# Patient Record
Sex: Male | Born: 1945 | Race: White | Hispanic: No | Marital: Married | Smoking: Former smoker
Health system: Southern US, Community
[De-identification: ages and names within clinical notes are randomized; demographics above are authoritative.]

## PROBLEM LIST (undated history)

## (undated) DIAGNOSIS — E039 Hypothyroidism, unspecified: Secondary | ICD-10-CM

## (undated) DIAGNOSIS — M1990 Unspecified osteoarthritis, unspecified site: Secondary | ICD-10-CM

## (undated) DIAGNOSIS — E669 Obesity, unspecified: Secondary | ICD-10-CM

## (undated) DIAGNOSIS — E041 Nontoxic single thyroid nodule: Secondary | ICD-10-CM

## (undated) DIAGNOSIS — IMO0001 Reserved for inherently not codable concepts without codable children: Secondary | ICD-10-CM

## (undated) DIAGNOSIS — C73 Malignant neoplasm of thyroid gland: Secondary | ICD-10-CM

## (undated) HISTORY — DX: Obesity, unspecified: E66.9

---

## 1978-09-06 HISTORY — PX: FINGER AMPUTATION: SHX636

## 2011-02-12 ENCOUNTER — Emergency Department (HOSPITAL_COMMUNITY)
Admission: EM | Admit: 2011-02-12 | Discharge: 2011-02-12 | Disposition: A | Discharge status: Home / Self Care | Payer: Medicare Other | Attending: Emergency Medicine | Admitting: Emergency Medicine

## 2011-02-12 ENCOUNTER — Emergency Department (HOSPITAL_COMMUNITY): Payer: Medicare Other

## 2011-02-12 ENCOUNTER — Encounter (HOSPITAL_COMMUNITY): Payer: Self-pay | Admitting: Emergency Medicine

## 2011-02-12 DIAGNOSIS — IMO0002 Reserved for concepts with insufficient information to code with codable children: Secondary | ICD-10-CM | POA: Insufficient documentation

## 2011-02-12 DIAGNOSIS — M1710 Unilateral primary osteoarthritis, unspecified knee: Secondary | ICD-10-CM | POA: Insufficient documentation

## 2011-02-12 DIAGNOSIS — M25569 Pain in unspecified knee: Secondary | ICD-10-CM | POA: Insufficient documentation

## 2011-02-12 MED ORDER — ONDANSETRON 4 MG PO TBDP
4.0000 mg | ORAL_TABLET | Freq: Once | ORAL | Status: AC
  Administered 2011-02-12: 4 mg via ORAL

## 2011-02-12 MED ORDER — ONDANSETRON 4 MG PO TBDP
ORAL_TABLET | ORAL | Status: AC
  Filled 2011-02-12: qty 1

## 2011-02-12 MED ORDER — KETOROLAC TROMETHAMINE 60 MG/2ML IM SOLN
60.0000 mg | Freq: Once | INTRAMUSCULAR | Status: AC
  Administered 2011-02-12: 60 mg via INTRAMUSCULAR
  Filled 2011-02-12: qty 2

## 2011-02-12 MED ORDER — ONDANSETRON 4 MG PO TBDP
4.0000 mg | ORAL_TABLET | Freq: Once | ORAL | Status: AC
  Administered 2011-02-12: 4 mg via ORAL
  Filled 2011-02-12: qty 1

## 2011-02-12 MED ORDER — HYDROCODONE-ACETAMINOPHEN 5-325 MG PO TABS: ORAL_TABLET | ORAL | Status: DC

## 2011-02-12 MED ORDER — ONDANSETRON HCL 4 MG PO TABS: 4.0000 mg | ORAL_TABLET | Freq: Four times a day (QID) | ORAL | Status: AC

## 2011-02-12 MED ORDER — HYDROMORPHONE HCL PF 2 MG/ML IJ SOLN
2.0000 mg | Freq: Once | INTRAMUSCULAR | Status: AC
  Administered 2011-02-12: 2 mg via INTRAMUSCULAR
  Filled 2011-02-12: qty 1

## 2011-02-12 NOTE — ED Provider Notes (Signed)
History     CSN: [id number]  Arrival date & time 02/12/11  1722   None     Chief Complaint  Patient presents with  . Knee Pain    (Consider location/radiation/quality/duration/timing/severity/associated sxs/prior treatment) HPI Comments: Pt has had pain in both knees "for years".  He stood up from a chair at home and had a sharp pain in L knee and pain since.  Has taken aleve with no relief.  He has known DJD.  Patient is a 66 y.o. male presenting with knee pain. The history is provided by the patient. No language interpreter was used.  Knee Pain This is a new problem. The current episode started yesterday. The problem occurs constantly. The problem has been unchanged. The symptoms are aggravated by bending, walking and standing. He has tried nothing for the symptoms.    History reviewed. No pertinent past medical history.  History reviewed. No pertinent past surgical history.  No family history on file.  History  Substance Use Topics  . Smoking status: Never Smoker   . Smokeless tobacco: Not on file  . Alcohol Use: No      Review of Systems  Musculoskeletal:       Knee pain.    All other systems reviewed and are negative.    Allergies  Review of patient's allergies indicates no known allergies.  Home Medications   Current Outpatient Rx  Name Route Sig Dispense Refill  . HYDROCODONE-ACETAMINOPHEN 5-500 MG PO TABS Oral Take 1 tablet by mouth every 6 (six) hours as needed. For pain      BP 131/69  Pulse 92  Temp(Src) 97.9 F (36.6 C) (Oral)  Resp 20  SpO2 99%  Physical Exam  Nursing note and vitals reviewed. Constitutional: He is oriented to person, place, and time. He appears well-developed and well-nourished. No distress.  HENT:  Head: Normocephalic and atraumatic.  Eyes: EOM are normal.  Neck: Normal range of motion.  Cardiovascular: Normal rate, regular rhythm, normal heart sounds and intact distal pulses.   Pulmonary/Chest: Effort normal and  breath sounds normal. No respiratory distress.  Abdominal: Soft. He exhibits no distension. There is no tenderness.  Musculoskeletal: He exhibits tenderness.       Left knee: He exhibits decreased range of motion. He exhibits no swelling, no effusion, no ecchymosis, no deformity, no laceration and no erythema. tenderness found.       Legs:      Localizes pain to anterior joint line of L knee.  Neurological: He is alert and oriented to person, place, and time.  Skin: Skin is warm and dry. He is not diaphoretic.  Psychiatric: He has a normal mood and affect. Judgment normal.    ED Course  Procedures (including critical care time)  Labs Reviewed - No data to display No results found.   No diagnosis found.    MDM          Worthy Rancher, PA 02/12/11 [phone]

## 2011-02-12 NOTE — ED Notes (Signed)
Pt c/o left knee pain since yesterday.

## 2011-02-13 NOTE — ED Provider Notes (Signed)
Medical screening examination/treatment/procedure(s) were performed by non-physician practitioner and as supervising physician I was immediately available for consultation/collaboration.  Daniela Hernan S. Berline Semrad, MD 02/13/11 1508 

## 2011-04-21 ENCOUNTER — Ambulatory Visit (INDEPENDENT_AMBULATORY_CARE_PROVIDER_SITE_OTHER): Payer: Medicare Other | Admitting: Urology

## 2011-04-21 DIAGNOSIS — N471 Phimosis: Secondary | ICD-10-CM

## 2014-01-05 DIAGNOSIS — E041 Nontoxic single thyroid nodule: Secondary | ICD-10-CM

## 2014-01-05 HISTORY — DX: Nontoxic single thyroid nodule: E04.1

## 2014-07-04 ENCOUNTER — Other Ambulatory Visit: Payer: Self-pay | Admitting: Otolaryngology

## 2014-07-30 NOTE — Pre-Procedure Instructions (Signed)
   Jason Cohen  07/30/2014     Your procedure is scheduled on : Wednesday August 08, 2014 at 8:30 AM.  Report to [facility] Admitting at 6:30 A.M.  Call this number if you have problems the morning of surgery: [phone]    Remember:  Do not eat food or drink liquids after midnight.  Take these medicines the morning of surgery with A SIP OF WATER : Hydrocodone if needed   Do not wear jewelry.  Do not wear lotions, powders, or cologne.    Men may shave face and neck.  Do not bring valuables to the hospital.  [facility] is not responsible for any belongings or valuables.  Contacts, dentures or bridgework may not be worn into surgery.  Leave your suitcase in the car.  After surgery it may be brought to your room.  For patients admitted to the hospital, discharge time will be determined by your treatment team.  Patients discharged the day of surgery will not be allowed to drive home.   Name and phone number of your driver:    Special instructions:  Shower using CHG soap the night before and the morning of your surgery  Please read over the following fact sheets that you were given. Pain Booklet, Coughing and Deep Breathing and Surgical Site Infection Prevention

## 2014-07-31 ENCOUNTER — Encounter (HOSPITAL_COMMUNITY): Payer: Self-pay

## 2014-07-31 ENCOUNTER — Encounter (HOSPITAL_COMMUNITY)
Admission: RE | Admit: 2014-07-31 | Discharge: 2014-07-31 | Disposition: A | Discharge status: Home / Self Care | Payer: Medicare HMO | Source: Ambulatory Visit | Attending: Otolaryngology | Admitting: Otolaryngology

## 2014-07-31 ENCOUNTER — Ambulatory Visit (HOSPITAL_COMMUNITY)
Admission: RE | Admit: 2014-07-31 | Discharge: 2014-07-31 | Disposition: A | Discharge status: Home / Self Care | Payer: Medicare HMO | Source: Ambulatory Visit | Attending: Otolaryngology | Admitting: Otolaryngology

## 2014-07-31 DIAGNOSIS — Z01811 Encounter for preprocedural respiratory examination: Secondary | ICD-10-CM | POA: Diagnosis present

## 2014-07-31 DIAGNOSIS — Z01812 Encounter for preprocedural laboratory examination: Secondary | ICD-10-CM | POA: Insufficient documentation

## 2014-07-31 DIAGNOSIS — R918 Other nonspecific abnormal finding of lung field: Secondary | ICD-10-CM | POA: Diagnosis not present

## 2014-07-31 DIAGNOSIS — Z01818 Encounter for other preprocedural examination: Secondary | ICD-10-CM

## 2014-07-31 HISTORY — DX: Reserved for inherently not codable concepts without codable children: IMO0001

## 2014-07-31 HISTORY — DX: Nontoxic single thyroid nodule: E04.1

## 2014-07-31 LAB — BASIC METABOLIC PANEL
ANION GAP: 8 (ref 5–15)
BUN: 6 mg/dL (ref 6–20)
CO2: 26 mmol/L (ref 22–32)
CREATININE: 0.82 mg/dL (ref 0.61–1.24)
Calcium: 9.4 mg/dL (ref 8.9–10.3)
Chloride: 106 mmol/L (ref 101–111)
GLUCOSE: 100 mg/dL — AB (ref 65–99)
Potassium: 3.9 mmol/L (ref 3.5–5.1)
SODIUM: 140 mmol/L (ref 135–145)

## 2014-07-31 LAB — CBC
HEMATOCRIT: 42 % (ref 39.0–52.0)
Hemoglobin: 13.4 g/dL (ref 13.0–17.0)
MCH: 26.9 pg (ref 26.0–34.0)
MCHC: 31.9 g/dL (ref 30.0–36.0)
MCV: 84.2 fL (ref 78.0–100.0)
Platelets: 254 10*3/uL (ref 150–400)
RBC: 4.99 MIL/uL (ref 4.22–5.81)
RDW: 14.1 % (ref 11.5–15.5)
WBC: 6.3 10*3/uL (ref 4.0–10.5)

## 2014-07-31 NOTE — Progress Notes (Signed)
PCP Matthias Hughs. Patient denied having any cardiac issues, but did inform Nurse that he can not lay on his back due to increased shortness of breath, and that he has to sleep on his stomach. Patient denied having any current shortness of breath.   Sleep apnea results sent to PCP.

## 2014-07-31 NOTE — Progress Notes (Signed)
   07/31/14 1012  OBSTRUCTIVE SLEEP APNEA  Have you ever been diagnosed with sleep apnea through a sleep study? No  Do you snore loudly (loud enough to be heard through closed doors)?  1  Do you often feel tired, fatigued, or sleepy during the daytime? 1  Has anyone observed you stop breathing during your sleep? 0  Do you have, or are you being treated for high blood pressure? 0  BMI more than 35 kg/m2? 1  Age over 67 years old? 1  Neck circumference greater than 40 cm/16 inches? 1  Gender: 1  Obstructive Sleep Apnea Score 6   This patient has screened at risk for sleep apnea using the STOP bang tool used during a pre-surgical visit. A score of 4 or greater is at risk for sleep apnea.

## 2014-08-08 ENCOUNTER — Ambulatory Visit (HOSPITAL_COMMUNITY): Payer: Medicare HMO | Admitting: Anesthesiology

## 2014-08-08 ENCOUNTER — Ambulatory Visit (HOSPITAL_BASED_OUTPATIENT_CLINIC_OR_DEPARTMENT_OTHER)
Admission: RE | Admit: 2014-08-08 | Discharge: 2014-08-09 | Disposition: A | Discharge status: Home / Self Care | Payer: Medicare HMO | Source: Ambulatory Visit | Attending: Otolaryngology | Admitting: Otolaryngology

## 2014-08-08 ENCOUNTER — Encounter (HOSPITAL_COMMUNITY): Payer: Self-pay | Admitting: *Deleted

## 2014-08-08 ENCOUNTER — Encounter (HOSPITAL_COMMUNITY)
Admission: RE | Disposition: A | Discharge status: Home / Self Care | Payer: Self-pay | Source: Ambulatory Visit | Attending: Otolaryngology

## 2014-08-08 ENCOUNTER — Ambulatory Visit (HOSPITAL_COMMUNITY): Payer: Medicare HMO | Admitting: Vascular Surgery

## 2014-08-08 DIAGNOSIS — C73 Malignant neoplasm of thyroid gland: Secondary | ICD-10-CM | POA: Diagnosis not present

## 2014-08-08 DIAGNOSIS — E890 Postprocedural hypothyroidism: Secondary | ICD-10-CM

## 2014-08-08 DIAGNOSIS — Z89021 Acquired absence of right finger(s): Secondary | ICD-10-CM | POA: Diagnosis not present

## 2014-08-08 DIAGNOSIS — Z6841 Body Mass Index (BMI) 40.0 and over, adult: Secondary | ICD-10-CM | POA: Diagnosis not present

## 2014-08-08 DIAGNOSIS — E0789 Other specified disorders of thyroid: Secondary | ICD-10-CM | POA: Diagnosis present

## 2014-08-08 DIAGNOSIS — M170 Bilateral primary osteoarthritis of knee: Secondary | ICD-10-CM | POA: Diagnosis not present

## 2014-08-08 DIAGNOSIS — Z87891 Personal history of nicotine dependence: Secondary | ICD-10-CM | POA: Insufficient documentation

## 2014-08-08 DIAGNOSIS — Z9889 Other specified postprocedural states: Secondary | ICD-10-CM

## 2014-08-08 HISTORY — DX: Unspecified osteoarthritis, unspecified site: M19.90

## 2014-08-08 HISTORY — PX: THYROIDECTOMY: SHX17

## 2014-08-08 SURGERY — THYROIDECTOMY
Anesthesia: General | Site: Throat | Laterality: Right

## 2014-08-08 MED ORDER — OXYCODONE-ACETAMINOPHEN 5-325 MG PO TABS: 1.0000 | ORAL_TABLET | ORAL | Status: DC | PRN

## 2014-08-08 MED ORDER — LIDOCAINE-EPINEPHRINE 1 %-1:100000 IJ SOLN
INTRAMUSCULAR | Status: DC | PRN
  Administered 2014-08-08: 20 mL

## 2014-08-08 MED ORDER — OXYCODONE HCL 5 MG PO TABS: 5.0000 mg | ORAL_TABLET | Freq: Once | ORAL | Status: DC | PRN

## 2014-08-08 MED ORDER — SODIUM CHLORIDE 0.9 % IV SOLN
10000.0000 ug | INTRAVENOUS | Status: DC | PRN
  Administered 2014-08-08: 20 ug/min via INTRAVENOUS

## 2014-08-08 MED ORDER — ONDANSETRON HCL 4 MG/2ML IJ SOLN: 4.0000 mg | INTRAMUSCULAR | Status: DC | PRN

## 2014-08-08 MED ORDER — FENTANYL CITRATE (PF) 100 MCG/2ML IJ SOLN
INTRAMUSCULAR | Status: AC
  Administered 2014-08-08: 50 ug via INTRAVENOUS
  Filled 2014-08-08: qty 2

## 2014-08-08 MED ORDER — MIDAZOLAM HCL 5 MG/5ML IJ SOLN
INTRAMUSCULAR | Status: DC | PRN
  Administered 2014-08-08: 2 mg via INTRAVENOUS

## 2014-08-08 MED ORDER — SODIUM CHLORIDE 0.9 % IV SOLN
0.1500 ug/kg/min | INTRAVENOUS | Status: DC
  Filled 2014-08-08: qty 2000

## 2014-08-08 MED ORDER — PROPOFOL 10 MG/ML IV BOLUS
INTRAVENOUS | Status: DC | PRN
  Administered 2014-08-08: 70 mg via INTRAVENOUS
  Administered 2014-08-08: 60 mg via INTRAVENOUS

## 2014-08-08 MED ORDER — FENTANYL CITRATE (PF) 250 MCG/5ML IJ SOLN
INTRAMUSCULAR | Status: AC
  Filled 2014-08-08: qty 5

## 2014-08-08 MED ORDER — LIDOCAINE-EPINEPHRINE 1 %-1:100000 IJ SOLN
INTRAMUSCULAR | Status: AC
  Filled 2014-08-08: qty 1

## 2014-08-08 MED ORDER — SUCCINYLCHOLINE CHLORIDE 20 MG/ML IJ SOLN
INTRAMUSCULAR | Status: DC | PRN
  Administered 2014-08-08: 40 mg via INTRAVENOUS

## 2014-08-08 MED ORDER — ONDANSETRON HCL 4 MG PO TABS: 4.0000 mg | ORAL_TABLET | ORAL | Status: DC | PRN

## 2014-08-08 MED ORDER — GLYCOPYRROLATE 0.2 MG/ML IJ SOLN
INTRAMUSCULAR | Status: DC | PRN
  Administered 2014-08-08: 0.4 mg via INTRAVENOUS

## 2014-08-08 MED ORDER — LACTATED RINGERS IV SOLN
INTRAVENOUS | Status: DC | PRN
  Administered 2014-08-08 (×2): via INTRAVENOUS

## 2014-08-08 MED ORDER — ACETAMINOPHEN 160 MG/5ML PO SOLN
325.0000 mg | ORAL | Status: DC | PRN
  Filled 2014-08-08: qty 20.3

## 2014-08-08 MED ORDER — PROPOFOL 10 MG/ML IV BOLUS
INTRAVENOUS | Status: AC
  Filled 2014-08-08: qty 20

## 2014-08-08 MED ORDER — MORPHINE SULFATE 2 MG/ML IJ SOLN: 2.0000 mg | INTRAMUSCULAR | Status: DC | PRN

## 2014-08-08 MED ORDER — MIDAZOLAM HCL 2 MG/2ML IJ SOLN
INTRAMUSCULAR | Status: AC
  Filled 2014-08-08: qty 4

## 2014-08-08 MED ORDER — ACETAMINOPHEN 325 MG PO TABS: 325.0000 mg | ORAL_TABLET | ORAL | Status: DC | PRN

## 2014-08-08 MED ORDER — REMIFENTANIL HCL 1 MG IV SOLR
0.1500 ug/kg/min | INTRAVENOUS | Status: DC
  Filled 2014-08-08: qty 1000

## 2014-08-08 MED ORDER — KCL IN DEXTROSE-NACL 20-5-0.45 MEQ/L-%-% IV SOLN
INTRAVENOUS | Status: DC
  Administered 2014-08-08: 23:00:00 via INTRAVENOUS
  Administered 2014-08-08: 100 mL/h via INTRAVENOUS
  Filled 2014-08-08 (×2): qty 1000

## 2014-08-08 MED ORDER — ONDANSETRON HCL 4 MG/2ML IJ SOLN
INTRAMUSCULAR | Status: DC | PRN
  Administered 2014-08-08: 4 mg via INTRAVENOUS

## 2014-08-08 MED ORDER — SODIUM CHLORIDE 0.9 % IV SOLN
0.1500 ug/kg/min | INTRAVENOUS | Status: AC
  Administered 2014-08-08: .3 ug/kg/min via INTRAVENOUS
  Filled 2014-08-08: qty 2000

## 2014-08-08 MED ORDER — OXYCODONE-ACETAMINOPHEN 5-325 MG PO TABS
1.0000 | ORAL_TABLET | ORAL | Status: DC | PRN
  Administered 2014-08-09: 2 via ORAL
  Filled 2014-08-08: qty 2

## 2014-08-08 MED ORDER — DEXTROSE 5 % IV SOLN
3.0000 g | INTRAVENOUS | Status: DC | PRN
  Administered 2014-08-08: 3 g via INTRAVENOUS

## 2014-08-08 MED ORDER — LIDOCAINE HCL (CARDIAC) 20 MG/ML IV SOLN
INTRAVENOUS | Status: DC | PRN
  Administered 2014-08-08: 60 mg via INTRAVENOUS

## 2014-08-08 MED ORDER — DEXAMETHASONE SODIUM PHOSPHATE 10 MG/ML IJ SOLN
INTRAMUSCULAR | Status: DC | PRN
  Administered 2014-08-08: 10 mg via INTRAVENOUS

## 2014-08-08 MED ORDER — FENTANYL CITRATE (PF) 100 MCG/2ML IJ SOLN
25.0000 ug | INTRAMUSCULAR | Status: DC | PRN
  Administered 2014-08-08 (×2): 50 ug via INTRAVENOUS

## 2014-08-08 MED ORDER — EPHEDRINE SULFATE 50 MG/ML IJ SOLN
INTRAMUSCULAR | Status: DC | PRN
  Administered 2014-08-08 (×3): 5 mg via INTRAVENOUS

## 2014-08-08 MED ORDER — ONDANSETRON HCL 4 MG/2ML IJ SOLN
4.0000 mg | Freq: Once | INTRAMUSCULAR | Status: AC | PRN
  Administered 2014-08-08: 4 mg via INTRAVENOUS

## 2014-08-08 MED ORDER — FENTANYL CITRATE (PF) 100 MCG/2ML IJ SOLN
INTRAMUSCULAR | Status: DC | PRN
  Administered 2014-08-08: 50 ug via INTRAVENOUS
  Administered 2014-08-08: 100 ug via INTRAVENOUS

## 2014-08-08 MED ORDER — AMOXICILLIN 875 MG PO TABS
875.0000 mg | ORAL_TABLET | Freq: Two times a day (BID) | ORAL | Status: DC
Start: 2014-08-08 — End: 2014-08-18

## 2014-08-08 MED ORDER — ONDANSETRON HCL 4 MG/2ML IJ SOLN
INTRAMUSCULAR | Status: AC
  Administered 2014-08-08: 4 mg via INTRAVENOUS
  Filled 2014-08-08: qty 2

## 2014-08-08 MED ORDER — 0.9 % SODIUM CHLORIDE (POUR BTL) OPTIME
TOPICAL | Status: DC | PRN
  Administered 2014-08-08: 1000 mL

## 2014-08-08 MED ORDER — ZOLPIDEM TARTRATE 5 MG PO TABS: 5.0000 mg | ORAL_TABLET | Freq: Every evening | ORAL | Status: DC | PRN

## 2014-08-08 MED ORDER — OXYCODONE HCL 5 MG/5ML PO SOLN: 5.0000 mg | Freq: Once | ORAL | Status: DC | PRN

## 2014-08-08 SURGICAL SUPPLY — 47 items
ATTRACTOMAT 16X20 MAGNETIC DRP (DRAPES) IMPLANT
BLADE SURG 15 STRL LF DISP TIS (BLADE) ×1 IMPLANT
BLADE SURG 15 STRL SS (BLADE) ×2
CANISTER SUCTION 2500CC (MISCELLANEOUS) ×3 IMPLANT
CLEANER TIP ELECTROSURG 2X2 (MISCELLANEOUS) ×3 IMPLANT
CLIP TI WIDE RED SMALL 24 (CLIP) IMPLANT
CONT SPEC 4OZ CLIKSEAL STRL BL (MISCELLANEOUS) ×3 IMPLANT
CORDS BIPOLAR (ELECTRODE) ×3 IMPLANT
COVER SURGICAL LIGHT HANDLE (MISCELLANEOUS) ×3 IMPLANT
CRADLE DONUT ADULT HEAD (MISCELLANEOUS) ×3 IMPLANT
DERMABOND ADVANCED (GAUZE/BANDAGES/DRESSINGS) ×2
DERMABOND ADVANCED .7 DNX12 (GAUZE/BANDAGES/DRESSINGS) ×1 IMPLANT
DRAIN CHANNEL 10F 3/8 F FF (DRAIN) ×3 IMPLANT
DRAPE PROXIMA HALF (DRAPES) ×3 IMPLANT
ELECT COATED BLADE 2.86 ST (ELECTRODE) ×3 IMPLANT
ELECT REM PT RETURN 9FT ADLT (ELECTROSURGICAL) ×3
ELECTRODE REM PT RTRN 9FT ADLT (ELECTROSURGICAL) ×1 IMPLANT
EVACUATOR SILICONE 100CC (DRAIN) ×3 IMPLANT
GAUZE SPONGE 4X4 16PLY XRAY LF (GAUZE/BANDAGES/DRESSINGS) ×42 IMPLANT
GLOVE BIO SURGEON STRL SZ 6.5 (GLOVE) ×2 IMPLANT
GLOVE BIO SURGEON STRL SZ7.5 (GLOVE) ×3 IMPLANT
GLOVE BIO SURGEONS STRL SZ 6.5 (GLOVE) ×1
GLOVE BIOGEL PI IND STRL 6.5 (GLOVE) ×1 IMPLANT
GLOVE BIOGEL PI IND STRL 7.0 (GLOVE) ×1 IMPLANT
GLOVE BIOGEL PI INDICATOR 6.5 (GLOVE) ×2
GLOVE BIOGEL PI INDICATOR 7.0 (GLOVE) ×2
GLOVE ECLIPSE 7.5 STRL STRAW (GLOVE) ×3 IMPLANT
GLOVE SURG SS PI 7.0 STRL IVOR (GLOVE) ×3 IMPLANT
GOWN STRL REUS W/ TWL LRG LVL3 (GOWN DISPOSABLE) ×3 IMPLANT
GOWN STRL REUS W/TWL LRG LVL3 (GOWN DISPOSABLE) ×6
HEMOSTAT SURGICEL 2X14 (HEMOSTASIS) IMPLANT
KIT BASIN OR (CUSTOM PROCEDURE TRAY) ×3 IMPLANT
KIT ROOM TURNOVER OR (KITS) ×3 IMPLANT
NEEDLE HYPO 25GX1X1/2 BEV (NEEDLE) ×3 IMPLANT
NS IRRIG 1000ML POUR BTL (IV SOLUTION) ×3 IMPLANT
PAD ARMBOARD 7.5X6 YLW CONV (MISCELLANEOUS) ×3 IMPLANT
PENCIL BUTTON HOLSTER BLD 10FT (ELECTRODE) ×3 IMPLANT
PROBE NERVBE PRASS .33 (MISCELLANEOUS) ×3 IMPLANT
SHEARS HARMONIC 9CM CVD (BLADE) ×3 IMPLANT
SPONGE INTESTINAL PEANUT (DISPOSABLE) ×3 IMPLANT
SUT ETHILON 2 0 FS 18 (SUTURE) ×3 IMPLANT
SUT SILK 2 0 FS (SUTURE) ×3 IMPLANT
SUT SILK 3 0 REEL (SUTURE) ×3 IMPLANT
SUT VICRYL 4-0 PS2 18IN ABS (SUTURE) ×3 IMPLANT
TAPE CLOTH 4X10 WHT NS (GAUZE/BANDAGES/DRESSINGS) ×3 IMPLANT
TRAY ENT MC OR (CUSTOM PROCEDURE TRAY) ×3 IMPLANT
TUBE ENDOTRAC EMG 7X10.2 (MISCELLANEOUS) ×3 IMPLANT

## 2014-08-08 NOTE — H&P (Signed)
Cc: Right thyroid mass  HPI: The patient is a 69 year old male who presents today for evaluation of his right thyroid mass.  The patient is seen in consultation requested by Dr. Matthias Hughs. According to the patient, Dr. Scotty Court noted a right thyroid mass in May of this year.  He subsequently underwent an ultrasound evaluation, which showed a large 8 cm right thyroid nodule.  He underwent a fine needle aspiration biopsy of the nodule.  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  Currently, the patient denies any dysphagia, odynophagia or dysphonia.  He denies any recent weight loss.    The patient's review of systems (constitutional, eyes, ENT, cardiovascular, respiratory, GI, musculoskeletal, skin, neurologic, psychiatric, endocrine, hematologic, allergic) is noted in the ROS questionnaire.  It is reviewed with the patient.  Family health history: Heart disease.   Major events: Right finger amputation.   Ongoing medical problems: Arthritis in knees.   Social history: The patient is married. He is a former tobacco smoker. He denies the use of alcohol or illegal drugs.   Exam General: Communicates without difficulty, well nourished, no acute distress. Head: Normocephalic, no evidence injury, no tenderness, facial buttresses intact without stepoff. Eyes: PERRL, EOMI. No scleral icterus, conjunctivae clear. Neuro: CN II exam reveals vision grossly intact.  No nystagmus at any point of gaze. Ears: Auricles well formed without lesions.  Ear canals are intact without mass or lesion.  No erythema or edema is appreciated.  The TMs are intact without fluid. Nose: External evaluation reveals normal support and skin without lesions.  Dorsum is intact.  Anterior rhinoscopy reveals healthy pink mucosa over anterior aspect of inferior turbinates and intact septum.  No purulence noted. Oral:  Oral cavity and oropharynx are intact, symmetric, without erythema  or edema.  Mucosa is moist without lesions. Neck: Full range of motion without pain.  There is no significant lymphadenopathy.  A large right thyroid lobe is noted. Neuro:  CN 2-12 grossly intact. Gait normal.   Procedure:  Flexible Fiberoptic Laryngoscopy Risks, benefits, and alternatives of flexible endoscopy were explained to the patient.  Specific mention was made of the risk of throat numbness with difficulty swallowing, possible bleeding from the nose and mouth, and pain from the procedure.  The patient gave oral consent to proceed.  The nasal cavities were decongested and anesthetised with a combination of oxymetazoline and 4% lidocaine solution.  The flexible scope was inserted into the right nasal cavity and advanced towards the nasopharynx.  Visualized mucosa over the turbinates and septum were as described above.  The nasopharynx was clear.  Oropharyngeal walls were symmetric and mobile without lesion, mass, or edema.  Hypopharynx was also without  lesion or edema.  Larynx was mobile without lesions. Supraglottic structures were free of edema, mass, and asymmetry.  True vocal folds were white without mass or lesion.  Base of tongue was within normal limits.   Assessment 1.  A large 8 cm right thyroid nodule is noted on his ultrasound examination.  2.  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   3.  Despite the large size of the right thyroid mass, the patient remains asymptomatic.  His vocal cords are noted to be mobile and symmetric bilaterally.   Plan  1.  The physical exam findings and laryngoscopy findings are reviewed with the patient.  2.  It is explained to the patient that a follicular neoplasm can be an adenoma  or a carcinoma.  The difference between the two cannot be assessed on the fine needle aspiration biopsy.  The differences can only be appreciated on a tissue specimen. A right hemithyroidectomy surgery is therefore recommended.  3.  The  risks, benefits, alternatives and details of the hemithyroidectomy procedure are discussed.  Questions are invited and answered.  4.  The patient would like to proceed with the procedure.

## 2014-08-08 NOTE — Discharge Instructions (Signed)
Thyroidectomy Care After Refer to this sheet in the next few weeks. These instructions provide you with general information on caring for yourself after you leave the hospital. Your caregiver also may give you specific instructions. Your treatment has been planned according to the most current medical practices available, but problems sometimes occur. Call your caregiver if you have any problems or questions after your procedure. HOME CARE INSTRUCTIONS   It is normal to be sore for a few weeks following surgery. See your caregiver if your pain seems to be getting worse rather than better.  You may resume a normal diet and activities as directed by your caregiver.  Avoid lifting weight greater than 20 lb (9 kg) or participating in heavy exercise or contact sports for 10 days or as instructed by your caregiver.  Make an appointment to see your caregiver for stitch (suture) or staple removal. SEEK MEDICAL CARE IF:   You have increased bleeding from your wound.  You have redness, swelling, or increasing pain from your wound or in your neck.  There is pus coming from your wound.  There is a bad smell coming from the wound or dressing.  You develop lightheadedness or feel faint.  You develop numbness, tingling, or muscle spasms in your arms, hands, feet, or face.  You have difficulty swallowing. SEEK IMMEDIATE MEDICAL CARE IF:   You develop a rash.  You have difficulty breathing.  You hear whistling noises that come from your chest.  You develop a cough that becomes increasingly worse.  You develop any reaction or side effects to medicines given.  There is swelling in your neck.  You develop changes in speech or hoarseness, which is getting worse. MAKE SURE YOU:   Understand these instructions.  Will watch your condition.  Will get help right away if you are not doing well or get worse. Document Released: 07/11/2004 Document Revised: 03/16/2011 Document Reviewed:  02/28/2010 [facility] Patient Information 2015 [place], [place]. This information is not intended to replace advice given to you by your health care provider. Make sure you discuss any questions you have with your health care provider.

## 2014-08-08 NOTE — Transfer of Care (Signed)
Immediate Anesthesia Transfer of Care Note  Patient: Jason Cohen  Procedure(s) Performed: Procedure(s): RIGHT HEMI THYROIDECTOMY (Right)  Patient Location: PACU  Anesthesia Type:General  Level of Consciousness: awake, alert  and patient cooperative  Airway & Oxygen Therapy: Patient Spontanous Breathing and Patient connected to face mask oxygen  Post-op Assessment: Report given to RN, Post -op Vital signs reviewed and stable, Patient moving all extremities and Patient moving all extremities X 4  Post vital signs: Reviewed and stable  Last Vitals:  Filed Vitals:   08/08/14 1115  BP: 142/87  Pulse: 101  Temp: 36.6 C  Resp: 13    Complications: No apparent anesthesia complications

## 2014-08-08 NOTE — Anesthesia Postprocedure Evaluation (Signed)
  Anesthesia Post-op Note  Patient: Jason Cohen  Procedure(s) Performed: Procedure(s): RIGHT HEMI THYROIDECTOMY (Right)  Patient Location: PACU  Anesthesia Type:General  Level of Consciousness: awake  Airway and Oxygen Therapy: Patient Spontanous Breathing  Post-op Pain: mild  Post-op Assessment: Post-op Vital signs reviewed, Patient's Cardiovascular Status Stable, Respiratory Function Stable, Patent Airway, No signs of Nausea or vomiting and Pain level controlled              Post-op Vital Signs: Reviewed and stable  Last Vitals:  Filed Vitals:   08/08/14 1429  BP: 113/75  Pulse: 96  Temp: 36.8 C  Resp: 16    Complications: No apparent anesthesia complications

## 2014-08-08 NOTE — Anesthesia Procedure Notes (Signed)
Procedure Name: Intubation Date/Time: 08/08/2014 8:41 AM Performed by: Shirlyn Goltz Pre-anesthesia Checklist: Patient identified, Emergency Drugs available, Suction available and Patient being monitored Patient Re-evaluated:Patient Re-evaluated prior to inductionOxygen Delivery Method: Circle system utilized Preoxygenation: Pre-oxygenation with 100% oxygen Intubation Type: IV induction Ventilation: Mask ventilation without difficulty, Two handed mask ventilation required and Oral airway inserted - appropriate to patient size Laryngoscope Size: Mac and 4 Grade View: Grade I Tube type: nims. Tube size: 7.0 mm Number of attempts: 1 Airway Equipment and Method: Stylet Placement Confirmation: ETT inserted through vocal cords under direct vision,  positive ETCO2 and breath sounds checked- equal and bilateral Tube secured with: Tape Dental Injury: Teeth and Oropharynx as per pre-operative assessment

## 2014-08-08 NOTE — Anesthesia Preprocedure Evaluation (Signed)
Anesthesia Evaluation  Patient identified by MRN, date of birth, ID band Patient awake    Reviewed: Allergy & Precautions, NPO status , Patient's Chart, lab work & pertinent test results  History of Anesthesia Complications Negative for: history of anesthetic complications  Airway Mallampati: IV  TM Distance: >3 FB Neck ROM: Full    Dental  (+) Edentulous Upper   Pulmonary  Unable to lie flat due to SOB breath sounds clear to auscultation        Cardiovascular negative cardio ROS  Rhythm:Regular     Neuro/Psych negative neurological ROS  negative psych ROS   GI/Hepatic negative GI ROS, Neg liver ROS,   Endo/Other  Morbid obesityThyroid mass  Renal/GU negative Renal ROS     Musculoskeletal negative musculoskeletal ROS (+)   Abdominal   Peds  Hematology negative hematology ROS (+)   Anesthesia Other Findings   Reproductive/Obstetrics                             Anesthesia Physical Anesthesia Plan  ASA: II  Anesthesia Plan: General   Post-op Pain Management:    Induction: Intravenous  Airway Management Planned: Oral ETT  Additional Equipment: None  Intra-op Plan:   Post-operative Plan: Extubation in OR  Informed Consent: I have reviewed the patients History and Physical, chart, labs and discussed the procedure including the risks, benefits and alternatives for the proposed anesthesia with the patient or authorized representative who has indicated his/her understanding and acceptance.   Dental advisory given  Plan Discussed with: CRNA and Surgeon  Anesthesia Plan Comments:         Anesthesia Quick Evaluation

## 2014-08-08 NOTE — Op Note (Signed)
DATE OF PROCEDURE:  08/08/2014                              OPERATIVE REPORT  SURGEON:  Leta Baptist, MD  ASST.: Rometta Emery, PA-C  PREOPERATIVE DIAGNOSES: 1. Right thyroid mass  POSTOPERATIVE DIAGNOSES: 1. Right thyroid mass  PROCEDURE PERFORMED:  Right hemithyroidectomy  ANESTHESIA:  General endotracheal tube anesthesia.  COMPLICATIONS:  None.  ESTIMATED BLOOD LOSS:  131ml  INDICATION FOR PROCEDURE:  Jason Cohen is a 69 y.o. male with a history of a large right thyroid mass. He underwent an ultrasound evaluation, which showed a large 8 cm right thyroid nodule. 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 Based on the above findings, the decision was made for patient to undergo the right hemithyroidectomy surgery. The risks, benefits, alternatives, and details of the procedure were discussed with the patient.  Questions were invited and answered.  Informed consent was obtained.  DESCRIPTION:  The patient was taken to the operating room and placed supine on the operating table.  General endotracheal tube anesthesia was administered by the anesthesiologist.  A nerve monitoring endotracheal tube was used.The patient was positioned and prepped and draped in a standard fashion for thyroid surgery.  1% lidocaine with 1-100,000 epinephrine was infiltrated at the planned site of incision. A transverse lower neck incision was made. The incision was carried down to the level of the platysma muscles. Superior and inferiorly based subplatysmal flaps were elevated in the standard fashion. The strap muscles were divided at midline, and retracted laterally, exposing the thyroid gland. The patient was noted to have a very large right thyroid mass, replacing most of the right thyroid lobe. Careful dissection was then performed to free the right thyroid lobe from the surrounding soft tissue. The recurrent laryngeal nerve  was stimulated and preserved. Hemostasis was achieved with a combination of suture ligatures, Harmonic scalpels, and electrocautery. The entire right thyroid lobe was removed and sent to the pathology department for permanent histologic identification.   The care of the patient was turned over to the anesthesiologist.  The patient was awakened from anesthesia without difficulty.  He was extubated and transferred to the recovery room in good condition.  OPERATIVE FINDINGS: A large 8 cm right thyroid mass was noted.  SPECIMEN:  Right thyroid lobe   FOLLOWUP CARE:  The patient will be observed overnight in the hospital. He will most likely be discharged home tomorrow.  The patient will follow up in my office in approximately 1 week.  Jason Cohen 08/08/2014 10:52 AM

## 2014-08-09 ENCOUNTER — Encounter (HOSPITAL_COMMUNITY): Payer: Self-pay | Admitting: Otolaryngology

## 2014-08-09 DIAGNOSIS — C73 Malignant neoplasm of thyroid gland: Secondary | ICD-10-CM | POA: Diagnosis not present

## 2014-08-09 NOTE — Discharge Summary (Signed)
Physician Discharge Summary  Patient ID: Jason Cohen MRN: [id number] DOB/AGE: 67-Jan-1947 67 y.o.  Admit date: 08/08/2014 Discharge date: 08/09/2014  Admission Diagnoses: Right thyroid mass  Discharge Diagnoses: Right thyroid mass Active Problems:   S/P partial thyroidectomy   Discharged Condition: good  Hospital Course: Pt had an uneventful overnight stay. Pt tolerated po well. No bleeding. No stridor. Voice is strong.  Consults: None  Significant Diagnostic Studies: none  Treatments: surgery: Right hemithyroidectomy  Discharge Exam: Blood pressure 119/64, pulse 68, temperature 98.3 F (36.8 C), temperature source Oral, resp. rate 17, height 6' (1.829 m), weight 166.017 kg (366 lb), SpO2 100 %. Incision/Wound:c/d/i Voice is strong  Disposition: 01-Home or Self Care  Discharge Instructions    Activity as tolerated - No restrictions    Complete by:  As directed      Diet general    Complete by:  As directed             Medication List    STOP taking these medications        HYDROcodone-acetaminophen 5-325 MG per tablet  Commonly known as:  NORCO/VICODIN      TAKE these medications        amoxicillin 875 MG tablet  Commonly known as:  AMOXIL  Take 1 tablet (875 mg total) by mouth 2 (two) times daily.     oxyCODONE-acetaminophen 5-325 MG per tablet  Commonly known as:  PERCOCET/ROXICET  Take 1-2 tablets by mouth every 4 (four) hours as needed for severe pain.           Follow-up Information    Follow up with [name], MD In 1 week.   Specialty:  Otolaryngology   Why:  As scheduled   Contact information:   [place] [id number] [phone]       Signed: [name] 08/09/2014, 7:35 AM

## 2014-08-09 NOTE — Progress Notes (Signed)
Pt verbally understood DC instructions, no questions asked 

## 2014-08-14 ENCOUNTER — Other Ambulatory Visit: Payer: Self-pay | Admitting: Otolaryngology

## 2014-08-16 ENCOUNTER — Encounter (HOSPITAL_COMMUNITY): Payer: Self-pay | Admitting: *Deleted

## 2014-08-16 NOTE — Progress Notes (Signed)
   08/16/14 1820  OBSTRUCTIVE SLEEP APNEA  Have you ever been diagnosed with sleep apnea through a sleep study? No  Do you snore loudly (loud enough to be heard through closed doors)?  1  Do you often feel tired, fatigued, or sleepy during the daytime? 1  Has anyone observed you stop breathing during your sleep? 0  BMI more than 35 kg/m2? 1  Age over 67 years old? 1  Gender: 1  Obstructive Sleep Apnea Score 5

## 2014-08-16 NOTE — Progress Notes (Signed)
Pt denies SOB, chest pain, and being under the care of a cardiologist. Pt denies having a stress test,echo and cardiac cath. Pt denies having an EKG within the last year. Pt made aware to stop  taking Aspirin, otc vitamins  and herbal medications. Do not take any NSAIDs ie: Ibuprofen, Advil, Naproxen or any medication containing Aspirin. Pt verbalized understanding of all pre-op instructions.

## 2014-08-17 ENCOUNTER — Encounter (HOSPITAL_COMMUNITY)
Admission: RE | Disposition: A | Discharge status: Home / Self Care | Payer: Self-pay | Source: Ambulatory Visit | Attending: Otolaryngology

## 2014-08-17 ENCOUNTER — Ambulatory Visit (HOSPITAL_COMMUNITY): Payer: Medicare HMO | Admitting: Anesthesiology

## 2014-08-17 ENCOUNTER — Ambulatory Visit (HOSPITAL_COMMUNITY)
Admission: RE | Admit: 2014-08-17 | Discharge: 2014-08-18 | Disposition: A | Discharge status: Home / Self Care | Payer: Medicare HMO | Source: Ambulatory Visit | Attending: Otolaryngology | Admitting: Otolaryngology

## 2014-08-17 ENCOUNTER — Encounter (HOSPITAL_COMMUNITY): Payer: Self-pay | Admitting: Anesthesiology

## 2014-08-17 DIAGNOSIS — Z6841 Body Mass Index (BMI) 40.0 and over, adult: Secondary | ICD-10-CM | POA: Insufficient documentation

## 2014-08-17 DIAGNOSIS — C73 Malignant neoplasm of thyroid gland: Secondary | ICD-10-CM | POA: Insufficient documentation

## 2014-08-17 DIAGNOSIS — E039 Hypothyroidism, unspecified: Secondary | ICD-10-CM | POA: Insufficient documentation

## 2014-08-17 DIAGNOSIS — M1990 Unspecified osteoarthritis, unspecified site: Secondary | ICD-10-CM | POA: Insufficient documentation

## 2014-08-17 DIAGNOSIS — Z9889 Other specified postprocedural states: Secondary | ICD-10-CM

## 2014-08-17 DIAGNOSIS — Z87891 Personal history of nicotine dependence: Secondary | ICD-10-CM | POA: Insufficient documentation

## 2014-08-17 DIAGNOSIS — D34 Benign neoplasm of thyroid gland: Secondary | ICD-10-CM | POA: Insufficient documentation

## 2014-08-17 DIAGNOSIS — R0602 Shortness of breath: Secondary | ICD-10-CM | POA: Diagnosis not present

## 2014-08-17 DIAGNOSIS — E890 Postprocedural hypothyroidism: Secondary | ICD-10-CM

## 2014-08-17 HISTORY — PX: THYROIDECTOMY: SHX17

## 2014-08-17 HISTORY — DX: Malignant neoplasm of thyroid gland: C73

## 2014-08-17 HISTORY — DX: Hypothyroidism, unspecified: E03.9

## 2014-08-17 HISTORY — PX: TOTAL THYROIDECTOMY: SHX2547

## 2014-08-17 LAB — BASIC METABOLIC PANEL
Anion gap: 8 (ref 5–15)
BUN: 6 mg/dL (ref 6–20)
CALCIUM: 9.4 mg/dL (ref 8.9–10.3)
CO2: 27 mmol/L (ref 22–32)
Chloride: 105 mmol/L (ref 101–111)
Creatinine, Ser: 0.83 mg/dL (ref 0.61–1.24)
GFR calc Af Amer: >60 mL/min (ref >60)
GFR calc non Af Amer: >60 mL/min (ref >60)
GLUCOSE: 111 mg/dL — AB (ref 65–99)
Potassium: 4 mmol/L (ref 3.5–5.1)
Sodium: 140 mmol/L (ref 135–145)

## 2014-08-17 LAB — CBC
HCT: 35.6 % — ABNORMAL LOW (ref 39.0–52.0)
Hemoglobin: 11.1 g/dL — ABNORMAL LOW (ref 13.0–17.0)
MCH: 26.4 pg (ref 26.0–34.0)
MCHC: 31.2 g/dL (ref 30.0–36.0)
MCV: 84.6 fL (ref 78.0–100.0)
PLATELETS: 228 10*3/uL (ref 150–400)
RBC: 4.21 MIL/uL — ABNORMAL LOW (ref 4.22–5.81)
RDW: 14.3 % (ref 11.5–15.5)
WBC: 6.4 10*3/uL (ref 4.0–10.5)

## 2014-08-17 LAB — CALCIUM
CALCIUM: 9 mg/dL (ref 8.9–10.3)
Calcium: 8.9 mg/dL (ref 8.9–10.3)

## 2014-08-17 SURGERY — THYROIDECTOMY
Anesthesia: General | Site: Neck

## 2014-08-17 MED ORDER — ONDANSETRON HCL 4 MG/2ML IJ SOLN: 4.0000 mg | INTRAMUSCULAR | Status: DC | PRN

## 2014-08-17 MED ORDER — SUCCINYLCHOLINE CHLORIDE 20 MG/ML IJ SOLN
INTRAMUSCULAR | Status: DC | PRN
  Administered 2014-08-17: 60 mg via INTRAVENOUS

## 2014-08-17 MED ORDER — MIDAZOLAM HCL 5 MG/5ML IJ SOLN
INTRAMUSCULAR | Status: DC | PRN
  Administered 2014-08-17: 2 mg via INTRAVENOUS

## 2014-08-17 MED ORDER — LIDOCAINE-EPINEPHRINE 1 %-1:100000 IJ SOLN
INTRAMUSCULAR | Status: AC
  Filled 2014-08-17: qty 1

## 2014-08-17 MED ORDER — DEXTROSE 5 % IV SOLN
10.0000 mg | INTRAVENOUS | Status: DC | PRN
  Administered 2014-08-17: 25 ug/min via INTRAVENOUS

## 2014-08-17 MED ORDER — HYDROMORPHONE HCL 1 MG/ML IJ SOLN
INTRAMUSCULAR | Status: AC
  Filled 2014-08-17: qty 1

## 2014-08-17 MED ORDER — EPHEDRINE SULFATE 50 MG/ML IJ SOLN
INTRAMUSCULAR | Status: AC
  Filled 2014-08-17: qty 1

## 2014-08-17 MED ORDER — ONDANSETRON HCL 4 MG/2ML IJ SOLN
INTRAMUSCULAR | Status: DC | PRN
  Administered 2014-08-17: 4 mg via INTRAVENOUS

## 2014-08-17 MED ORDER — ZOLPIDEM TARTRATE 5 MG PO TABS: 5.0000 mg | ORAL_TABLET | Freq: Every evening | ORAL | Status: DC | PRN

## 2014-08-17 MED ORDER — OXYCODONE HCL 5 MG/5ML PO SOLN: 5.0000 mg | Freq: Once | ORAL | Status: DC | PRN

## 2014-08-17 MED ORDER — ACETAMINOPHEN 160 MG/5ML PO SOLN: 325.0000 mg | ORAL | Status: DC | PRN

## 2014-08-17 MED ORDER — SODIUM CHLORIDE 0.9 % IJ SOLN
INTRAMUSCULAR | Status: AC
  Filled 2014-08-17: qty 10

## 2014-08-17 MED ORDER — MORPHINE SULFATE 2 MG/ML IJ SOLN: 2.0000 mg | INTRAMUSCULAR | Status: DC | PRN

## 2014-08-17 MED ORDER — FENTANYL CITRATE (PF) 250 MCG/5ML IJ SOLN
INTRAMUSCULAR | Status: AC
  Filled 2014-08-17: qty 5

## 2014-08-17 MED ORDER — HYDROMORPHONE HCL 1 MG/ML IJ SOLN
0.2500 mg | INTRAMUSCULAR | Status: DC | PRN
  Administered 2014-08-17: 0.5 mg via INTRAVENOUS

## 2014-08-17 MED ORDER — ONDANSETRON HCL 4 MG PO TABS: 4.0000 mg | ORAL_TABLET | ORAL | Status: DC | PRN

## 2014-08-17 MED ORDER — ROCURONIUM BROMIDE 50 MG/5ML IV SOLN
INTRAVENOUS | Status: AC
  Filled 2014-08-17: qty 1

## 2014-08-17 MED ORDER — HYDROMORPHONE HCL 1 MG/ML IJ SOLN
0.2500 mg | INTRAMUSCULAR | Status: DC | PRN
Start: 2014-08-17 — End: 2014-08-17
  Administered 2014-08-17: 0.5 mg via INTRAVENOUS

## 2014-08-17 MED ORDER — FENTANYL CITRATE (PF) 100 MCG/2ML IJ SOLN
INTRAMUSCULAR | Status: DC | PRN
  Administered 2014-08-17: 100 ug via INTRAVENOUS
  Administered 2014-08-17 (×3): 50 ug via INTRAVENOUS

## 2014-08-17 MED ORDER — 0.9 % SODIUM CHLORIDE (POUR BTL) OPTIME
TOPICAL | Status: DC | PRN
  Administered 2014-08-17: 1000 mL

## 2014-08-17 MED ORDER — MIDAZOLAM HCL 2 MG/2ML IJ SOLN
INTRAMUSCULAR | Status: AC
  Filled 2014-08-17: qty 4

## 2014-08-17 MED ORDER — LACTATED RINGERS IV SOLN
INTRAVENOUS | Status: DC
  Administered 2014-08-17: 08:00:00 via INTRAVENOUS

## 2014-08-17 MED ORDER — CEFAZOLIN SODIUM-DEXTROSE 2-3 GM-% IV SOLR
INTRAVENOUS | Status: AC
  Filled 2014-08-17: qty 50

## 2014-08-17 MED ORDER — LIDOCAINE HCL (CARDIAC) 20 MG/ML IV SOLN
INTRAVENOUS | Status: DC | PRN
  Administered 2014-08-17: 40 mg via INTRAVENOUS

## 2014-08-17 MED ORDER — ONDANSETRON HCL 4 MG/2ML IJ SOLN
INTRAMUSCULAR | Status: AC
  Filled 2014-08-17: qty 2

## 2014-08-17 MED ORDER — OXYCODONE HCL 5 MG PO TABS
5.0000 mg | ORAL_TABLET | Freq: Once | ORAL | Status: DC | PRN
Start: 2014-08-17 — End: 2014-08-17

## 2014-08-17 MED ORDER — CALCIUM CARBONATE-VITAMIN D 500-200 MG-UNIT PO TABS
2.0000 | ORAL_TABLET | Freq: Two times a day (BID) | ORAL | Status: DC
  Administered 2014-08-17 – 2014-08-18 (×3): 2 via ORAL
  Filled 2014-08-17: qty 2
  Filled 2014-08-17: qty 1
  Filled 2014-08-17: qty 2

## 2014-08-17 MED ORDER — KCL IN DEXTROSE-NACL 20-5-0.45 MEQ/L-%-% IV SOLN
INTRAVENOUS | Status: DC
  Administered 2014-08-17 (×2): via INTRAVENOUS
  Filled 2014-08-17 (×2): qty 1000

## 2014-08-17 MED ORDER — LIDOCAINE HCL (CARDIAC) 20 MG/ML IV SOLN
INTRAVENOUS | Status: AC
  Filled 2014-08-17: qty 5

## 2014-08-17 MED ORDER — OXYCODONE-ACETAMINOPHEN 5-325 MG PO TABS
1.0000 | ORAL_TABLET | ORAL | Status: DC | PRN
  Administered 2014-08-17 (×3): 1 via ORAL
  Filled 2014-08-17: qty 1
  Filled 2014-08-17: qty 2
  Filled 2014-08-17: qty 1

## 2014-08-17 MED ORDER — PROPOFOL 10 MG/ML IV BOLUS
INTRAVENOUS | Status: AC
  Filled 2014-08-17: qty 20

## 2014-08-17 MED ORDER — ACETAMINOPHEN 325 MG PO TABS: 325.0000 mg | ORAL_TABLET | ORAL | Status: DC | PRN

## 2014-08-17 MED ORDER — PROPOFOL 10 MG/ML IV BOLUS
INTRAVENOUS | Status: DC | PRN
  Administered 2014-08-17: 200 mg via INTRAVENOUS
  Administered 2014-08-17: 30 mg via INTRAVENOUS

## 2014-08-17 MED ORDER — CEFAZOLIN SODIUM-DEXTROSE 2-3 GM-% IV SOLR
INTRAVENOUS | Status: DC | PRN
  Administered 2014-08-17: 2 g via INTRAVENOUS

## 2014-08-17 MED ORDER — LIDOCAINE-EPINEPHRINE 1 %-1:100000 IJ SOLN
INTRAMUSCULAR | Status: DC | PRN
  Administered 2014-08-17: 20 mL

## 2014-08-17 SURGICAL SUPPLY — 50 items
ATTRACTOMAT 16X20 MAGNETIC DRP (DRAPES) ×3 IMPLANT
BLADE SURG 15 STRL LF DISP TIS (BLADE) IMPLANT
BLADE SURG 15 STRL SS (BLADE)
BLADE SURG CLIPPER 3M 9600 (MISCELLANEOUS) IMPLANT
CANISTER SUCTION 2500CC (MISCELLANEOUS) ×3 IMPLANT
CLEANER TIP ELECTROSURG 2X2 (MISCELLANEOUS) ×3 IMPLANT
CLIP TI WIDE RED SMALL 24 (CLIP) IMPLANT
CONT SPEC 4OZ CLIKSEAL STRL BL (MISCELLANEOUS) IMPLANT
CORDS BIPOLAR (ELECTRODE) ×3 IMPLANT
COVER SURGICAL LIGHT HANDLE (MISCELLANEOUS) ×3 IMPLANT
CRADLE DONUT ADULT HEAD (MISCELLANEOUS) ×3 IMPLANT
DERMABOND ADVANCED (GAUZE/BANDAGES/DRESSINGS) ×2
DERMABOND ADVANCED .7 DNX12 (GAUZE/BANDAGES/DRESSINGS) ×1 IMPLANT
DRAIN CHANNEL 10F 3/8 F FF (DRAIN) ×3 IMPLANT
DRAPE PROXIMA HALF (DRAPES) ×3 IMPLANT
ELECT COATED BLADE 2.86 ST (ELECTRODE) ×3 IMPLANT
ELECT REM PT RETURN 9FT ADLT (ELECTROSURGICAL) ×3
ELECTRODE REM PT RTRN 9FT ADLT (ELECTROSURGICAL) ×1 IMPLANT
EVACUATOR SILICONE 100CC (DRAIN) ×3 IMPLANT
GAUZE SPONGE 4X4 16PLY XRAY LF (GAUZE/BANDAGES/DRESSINGS) ×3 IMPLANT
GLOVE BIO SURGEON STRL SZ 6.5 (GLOVE) ×2 IMPLANT
GLOVE BIO SURGEON STRL SZ7.5 (GLOVE) ×9 IMPLANT
GLOVE BIO SURGEONS STRL SZ 6.5 (GLOVE) ×1
GLOVE BIOGEL PI IND STRL 6.5 (GLOVE) ×1 IMPLANT
GLOVE BIOGEL PI IND STRL 7.5 (GLOVE) ×2 IMPLANT
GLOVE BIOGEL PI INDICATOR 6.5 (GLOVE) ×2
GLOVE BIOGEL PI INDICATOR 7.5 (GLOVE) ×4
GLOVE ECLIPSE 7.5 STRL STRAW (GLOVE) ×3 IMPLANT
GOWN STRL REUS W/ TWL LRG LVL3 (GOWN DISPOSABLE) ×3 IMPLANT
GOWN STRL REUS W/TWL LRG LVL3 (GOWN DISPOSABLE) ×6
HEMOSTAT SURGICEL 2X14 (HEMOSTASIS) IMPLANT
KIT BASIN OR (CUSTOM PROCEDURE TRAY) ×3 IMPLANT
KIT ROOM TURNOVER OR (KITS) ×3 IMPLANT
NEEDLE HYPO 25GX1X1/2 BEV (NEEDLE) ×3 IMPLANT
NS IRRIG 1000ML POUR BTL (IV SOLUTION) ×3 IMPLANT
PAD ARMBOARD 7.5X6 YLW CONV (MISCELLANEOUS) ×3 IMPLANT
PENCIL BUTTON HOLSTER BLD 10FT (ELECTRODE) ×3 IMPLANT
PROBE NERVBE PRASS .33 (MISCELLANEOUS) ×3 IMPLANT
SHEARS HARMONIC 9CM CVD (BLADE) ×3 IMPLANT
SPONGE INTESTINAL PEANUT (DISPOSABLE) ×3 IMPLANT
SUT ETHILON 2 0 FS 18 (SUTURE) ×3 IMPLANT
SUT SILK 2 0 FS (SUTURE) ×3 IMPLANT
SUT SILK 3 0 REEL (SUTURE) ×3 IMPLANT
SUT VICRYL 4-0 PS2 18IN ABS (SUTURE) ×3 IMPLANT
TAPE CLOTH 4X10 WHT NS (GAUZE/BANDAGES/DRESSINGS) ×3 IMPLANT
TOWEL OR 17X24 6PK STRL BLUE (TOWEL DISPOSABLE) ×3 IMPLANT
TRAY CATH 16FR W/PLASTIC CATH (SET/KITS/TRAYS/PACK) ×3 IMPLANT
TRAY ENT MC OR (CUSTOM PROCEDURE TRAY) ×3 IMPLANT
TRAY FOLEY CATH 14FRSI W/METER (CATHETERS) IMPLANT
TUBE ENDOTRAC EMG 7X10.2 (MISCELLANEOUS) ×3 IMPLANT

## 2014-08-17 NOTE — Transfer of Care (Signed)
Immediate Anesthesia Transfer of Care Note  Patient: Jason Cohen  Procedure(s) Performed: Procedure(s): COMPLETION THYROIDECTOMY (N/A)  Patient Location: PACU  Anesthesia Type:General  Level of Consciousness: awake, alert  and oriented  Airway & Oxygen Therapy: Patient Spontanous Breathing and Patient connected to face mask oxygen  Post-op Assessment: Report given to RN, Post -op Vital signs reviewed and stable and Patient moving all extremities X 4  Post vital signs: Reviewed and stable  Last Vitals:  Filed Vitals:   08/17/14 0816  BP: 119/65  Pulse:   Temp:   Resp:     Complications: No apparent anesthesia complications

## 2014-08-17 NOTE — Op Note (Signed)
DATE OF PROCEDURE:  08/17/2014                              OPERATIVE REPORT  SURGEON:  Leta Baptist, MD  ASSISTANT: Sharyn Dross  PREOPERATIVE DIAGNOSES: 1. Follicular thyroid carcinoma  POSTOPERATIVE DIAGNOSES: 1. Follicular thyroid carcinoma  PROCEDURE PERFORMED:  Completion thyroidectomy  ANESTHESIA:  General endotracheal tube anesthesia.  COMPLICATIONS:  None.  ESTIMATED BLOOD LOSS:  100 ml  INDICATION FOR PROCEDURE:  Jason Cohen is a 69 y.o. male with a history of a large right thyroid mass. He underwent right hemithyroidectomy surgery one week ago. The pathology was consistent with follicular carcinoma. The decision was therefore made for patient to undergo completion thyroidectomy surgery to remove the residual thyroid tissue.  The risks, benefits, alternatives, and details of the procedure were discussed with the mother.  Questions were invited and answered.  Informed consent was obtained.  DESCRIPTION:  The patient was taken to the operating room and placed supine on the operating table.  General endotracheal tube anesthesia was administered by the anesthesiologist.  The patient was positioned and prepped and draped in a standard fashion for thyroidectomy surgery.  Preop IV antibiotics was given. 1% lidocaine with 1-100,000 epinephrine was infiltrated at the planned site of incision.  The previous neck incision was opened using a pair of scissors.  The strap muscles were divided at midline, and retracted laterally, exposing the residual left thyroid lobe. Careful dissection was performed to dissect free the thyroid lobe from the surrounding soft tissue. An inferior left parathyroid gland was identified and preserved. The left recurrent laryngeal nerve was stimulated and preserved. The remaining left thyroid gland was then removed, and sent to the pathology department for permanent histologic identification. The surgical site was copiously irrigated. A #10 JP drain was placed. The  strap muscles were reapproximated with 4-0 Vicryls sutures. The skin incision was closed in layers with 4-0 Vicryls and Dermabond.   The care of the patient was turned over to the anesthesiologist.  The patient was awakened from anesthesia without difficulty.  He was extubated and transferred to the recovery room in good condition.  OPERATIVE FINDINGS:  The remaining left thyroid lobe was removed without difficulty.   SPECIMEN:  Completion thyroidectomy specimen (left thyroid lobe).   FOLLOWUP CARE:  The patient will be observed overnight. His calcium level will be monitored. He will be discharged home once his calcium level is stable.  [name] 08/17/2014 11:34 AM

## 2014-08-17 NOTE — H&P (Signed)
H&P Update  Pt's original H&P dated 08/08/14 reviewed. The pathology of the right thyroid lobe was consistent with follicular carcinoma. I personally examined the patient today.  No change in health. Proceed with completion thyroidectomy.

## 2014-08-17 NOTE — Anesthesia Procedure Notes (Signed)
Procedure Name: Intubation Date/Time: 08/17/2014 9:43 AM Performed by: Kyung Rudd Pre-anesthesia Checklist: Patient identified, Emergency Drugs available, Suction available, Patient being monitored and Timeout performed Patient Re-evaluated:Patient Re-evaluated prior to inductionOxygen Delivery Method: Circle system utilized Preoxygenation: Pre-oxygenation with 100% oxygen Intubation Type: IV induction Ventilation: Mask ventilation without difficulty and Oral airway inserted - appropriate to patient size Laryngoscope Size: Mac and 4 Grade View: Grade I Tube type: Oral (Nims tube) Tube size: 7.0 mm Number of attempts: 1 Airway Equipment and Method: Stylet and LTA kit utilized Placement Confirmation: ETT inserted through vocal cords under direct vision,  positive ETCO2 and breath sounds checked- equal and bilateral Secured at: 22 cm Tube secured with: Tape Dental Injury: Teeth and Oropharynx as per pre-operative assessment

## 2014-08-17 NOTE — Anesthesia Preprocedure Evaluation (Addendum)
Anesthesia Evaluation  Patient identified by MRN, date of birth, ID band Patient awake    Reviewed: Allergy & Precautions, NPO status , Patient's Chart, lab work & pertinent test results  History of Anesthesia Complications Negative for: history of anesthetic complications  Airway Mallampati: II  TM Distance: >3 FB Neck ROM: Full    Dental  (+) Edentulous Upper, Teeth Intact   Pulmonary shortness of breath, former smoker,  breath sounds clear to auscultation        Cardiovascular Rhythm:Regular     Neuro/Psych negative neurological ROS  negative psych ROS   GI/Hepatic negative GI ROS, Neg liver ROS,   Endo/Other  Hypothyroidism Morbid obesity  Renal/GU negative Renal ROS     Musculoskeletal  (+) Arthritis -,   Abdominal   Peds  Hematology negative hematology ROS (+)   Anesthesia Other Findings   Reproductive/Obstetrics                            Anesthesia Physical Anesthesia Plan  ASA: III  Anesthesia Plan: General   Post-op Pain Management:    Induction: Intravenous  Airway Management Planned: Oral ETT  Additional Equipment: None  Intra-op Plan:   Post-operative Plan: Extubation in OR  Informed Consent: I have reviewed the patients History and Physical, chart, labs and discussed the procedure including the risks, benefits and alternatives for the proposed anesthesia with the patient or authorized representative who has indicated his/her understanding and acceptance.   Dental advisory given  Plan Discussed with: CRNA, Anesthesiologist and Surgeon  Anesthesia Plan Comments:        Anesthesia Quick Evaluation

## 2014-08-17 NOTE — Progress Notes (Signed)
Arrived to 6n09 from PACU.enies nausea at this time. States pain 5/10. Oriented to room and surroundings.family at bedside

## 2014-08-17 NOTE — Discharge Instructions (Signed)
Thyroidectomy Care After Refer to this sheet in the next few weeks. These instructions provide you with general information on caring for yourself after you leave the hospital. Your caregiver also may give you specific instructions. Your treatment has been planned according to the most current medical practices available, but problems sometimes occur. Call your caregiver if you have any problems or questions after your procedure. HOME CARE INSTRUCTIONS   It is normal to be sore for a few weeks following surgery. See your caregiver if your pain seems to be getting worse rather than better.  You may resume a normal diet and activities as directed by your caregiver.  Avoid lifting weight greater than 20 lb (9 kg) or participating in heavy exercise or contact sports for 10 days or as instructed by your caregiver.  Make an appointment to see your caregiver for stitch (suture) or staple removal. SEEK MEDICAL CARE IF:   You have increased bleeding from your wound.  You have redness, swelling, or increasing pain from your wound or in your neck.  There is pus coming from your wound.  You have an oral temperature above 102 F (38.9 C).  There is a bad smell coming from the wound or dressing.  You develop lightheadedness or feel faint.  You develop numbness, tingling, or muscle spasms in your arms, hands, feet, or face.  You have difficulty swallowing. SEEK IMMEDIATE MEDICAL CARE IF:   You develop a rash.  You have difficulty breathing.  You hear whistling noises that come from your chest.  You develop a cough that becomes increasingly worse.  You develop any reaction or side effects to medicines given.  There is swelling in your neck.  You develop changes in speech or hoarseness, which is getting worse. MAKE SURE YOU:   Understand these instructions.  Will watch your condition.  Will get help right away if you are not doing well or get worse. Document Released: 07/11/2004  Document Revised: 03/16/2011 Document Reviewed: 02/28/2010 [facility] Patient Information 2015 [place], [place]. This information is not intended to replace advice given to you by your health care provider. Make sure you discuss any questions you have with your health care provider.

## 2014-08-18 DIAGNOSIS — C73 Malignant neoplasm of thyroid gland: Secondary | ICD-10-CM | POA: Diagnosis not present

## 2014-08-18 LAB — CALCIUM
Calcium: 9.1 mg/dL (ref 8.9–10.3)
Calcium: 9.1 mg/dL (ref 8.9–10.3)

## 2014-08-18 MED ORDER — OXYCODONE-ACETAMINOPHEN 5-325 MG PO TABS: 1.0000 | ORAL_TABLET | ORAL | Status: AC | PRN

## 2014-08-18 MED ORDER — LEVOTHYROXINE SODIUM 100 MCG PO TABS: 100.0000 ug | ORAL_TABLET | Freq: Every day | ORAL | Status: DC

## 2014-08-18 MED ORDER — CALCIUM CARBONATE-VITAMIN D 500-200 MG-UNIT PO TABS: 2.0000 | ORAL_TABLET | Freq: Two times a day (BID) | ORAL | Status: AC

## 2014-08-18 MED ORDER — CLINDAMYCIN HCL 300 MG PO CAPS: 300.0000 mg | ORAL_CAPSULE | Freq: Three times a day (TID) | ORAL | Status: DC

## 2014-08-18 NOTE — Discharge Summary (Signed)
Physician Discharge Summary  Patient ID: Jason Cohen MRN: [id number] DOB/AGE: 04-05-1945 69 y.o.  Admit date: 08/17/2014 Discharge date: 08/18/2014  Admission Diagnoses: Follicular thyroid carcinoma  Discharge Diagnoses: Follicular thyroid carcinoma Active Problems:   S/P complete thyroidectomy   Discharged Condition: good  Hospital Course: Pt had an uneventful overnight stay. Pt tolerated po well. No bleeding. No stridor. Voice is strong.  Consults: None  Significant Diagnostic Studies: None  Treatments: surgery: Completion thyroidectomy  Discharge Exam: Blood pressure 122/64, pulse 70, temperature 97.7 F (36.5 C), temperature source Oral, resp. rate 18, height 6' (1.829 m), weight 166 kg (365 lb 15.4 oz), SpO2 96 %. Incision/Wound:c/d/i Voice is strong.  Disposition: 01-Home or Self Care  Discharge Instructions    Activity as tolerated - No restrictions    Complete by:  As directed      Diet general    Complete by:  As directed             Medication List    STOP taking these medications        amoxicillin 875 MG tablet  Commonly known as:  AMOXIL     HYDROcodone-acetaminophen 10-325 MG per tablet  Commonly known as:  NORCO      TAKE these medications        calcium-vitamin D 500-200 MG-UNIT per tablet  Commonly known as:  OSCAL WITH D  Take 2 tablets by mouth 2 (two) times daily.     clindamycin 300 MG capsule  Commonly known as:  CLEOCIN  Take 1 capsule (300 mg total) by mouth 3 (three) times daily.     levothyroxine 100 MCG tablet  Commonly known as:  SYNTHROID  Take 1 tablet (100 mcg total) by mouth daily before breakfast.     oxyCODONE-acetaminophen 5-325 MG per tablet  Commonly known as:  PERCOCET/ROXICET  Take 1-2 tablets by mouth every 4 (four) hours as needed for severe pain.           Follow-up Information    Follow up with [name], MD On 08/23/2014.   Specialty:  Otolaryngology   Why:  at [facility] information:   [address] [place] [address] [phone]       Follow up with [name], MD On 08/23/2014.   Specialty:  Otolaryngology   Why:  at [facility] information:   [address] [place] [address] [phone]       Signed: [name] 08/18/2014, 2:59 PM

## 2014-08-19 NOTE — Anesthesia Postprocedure Evaluation (Signed)
  Anesthesia Post-op Note  Patient: Jason Cohen  Procedure(s) Performed: Procedure(s): COMPLETION THYROIDECTOMY (N/A)  Patient Location: PACU  Anesthesia Type:General  Level of Consciousness: awake  Airway and Oxygen Therapy: Patient Spontanous Breathing  Post-op Pain: mild  Post-op Assessment: Post-op Vital signs reviewed, Patient's Cardiovascular Status Stable, Respiratory Function Stable, Patent Airway, No signs of Nausea or vomiting and Pain level controlled              Post-op Vital Signs: Reviewed and stable  Last Vitals:  Filed Vitals:   08/18/14 1255  BP: 122/64  Pulse: 70  Temp: 36.5 C  Resp: 18    Complications: No apparent anesthesia complications

## 2014-08-20 ENCOUNTER — Encounter (HOSPITAL_COMMUNITY): Payer: Self-pay | Admitting: Otolaryngology

## 2014-08-23 ENCOUNTER — Ambulatory Visit (INDEPENDENT_AMBULATORY_CARE_PROVIDER_SITE_OTHER): Payer: Medicare HMO | Admitting: Otolaryngology

## 2014-09-06 ENCOUNTER — Ambulatory Visit (INDEPENDENT_AMBULATORY_CARE_PROVIDER_SITE_OTHER): Payer: Medicare HMO | Admitting: Otolaryngology

## 2014-10-04 ENCOUNTER — Encounter: Payer: Self-pay | Admitting: "Endocrinology

## 2014-10-04 ENCOUNTER — Ambulatory Visit (INDEPENDENT_AMBULATORY_CARE_PROVIDER_SITE_OTHER): Payer: Medicare HMO | Admitting: "Endocrinology

## 2014-10-04 VITALS — BP 139/79 | HR 69 | Wt 373.0 lb

## 2014-10-04 DIAGNOSIS — C73 Malignant neoplasm of thyroid gland: Secondary | ICD-10-CM

## 2014-10-04 DIAGNOSIS — E890 Postprocedural hypothyroidism: Secondary | ICD-10-CM | POA: Diagnosis not present

## 2014-10-04 MED ORDER — LEVOTHYROXINE SODIUM 100 MCG PO TABS: 150.0000 ug | ORAL_TABLET | Freq: Every day | ORAL | Status: DC

## 2014-10-04 MED ORDER — LEVOTHYROXINE SODIUM 150 MCG PO TABS: 150.0000 ug | ORAL_TABLET | Freq: Every day | ORAL | Status: DC

## 2014-10-04 NOTE — Progress Notes (Signed)
HPI  Jason Cohen is a 69 y.o.-year-old male, referred by his PCP, Dr.  Matthias Hughs  for management of thyroid cancer and postsurgical hypothyroidism.  Pt. has been dx with thyroid cancer in August 2016. He is status post near total thyroidectomy :  -  On 08/08/2014:  Right Thyroid, lobectomy which showed:  - FOLLICULAR (HURTHLE CELL) CARCINOMA. - LYMPHOVASCULAR INVASION PRESENT. - FOCALLY DISRUPTED AND CAUTERIZED MARGIN WITH ADJACENT TUMOR, SEE COMMENT. - ONE OF ONE LYMPH NODES NEGATIVE FOR CARCINOMA (0/1).  -  On 08/17/2014 : Left   Thyroid, lobectomy which showed:  - ADENOMATOUS NODULES. - NO INVOLVEMENT BY HURTHLE CELL CARCINOMA.  . Pt denies feeling nodules in neck, hoarseness, dysphagia/odynophagia, SOB with lying down.  For her postsurgical hypothyroidism, - He was initiated on LT4 100 mcg po qam. He reports compliance. . - He has no recent TFTs to review. Pt describes:- fatigue,  cold intolerance, constipation.He says he lost approximately 15 lbs during the process. He has  No  FH of thyroid disorders nor  FH of thyroid cancer.  No h/o radiation tx to head or neck.  ROS: Constitutional: No weight gain, + fatigue, no subjective hyperthermia/hypothermia Eyes: no blurry vision, no xerophthalmia ENT: no sore throat, no nodules palpated in throat, no dysphagia/odynophagia, no hoarseness Cardiovascular: no CP/SOB/palpitations/leg swelling Respiratory: no cough/SOB Gastrointestinal: no N/V/D/C Musculoskeletal: Uses crutches to ambulate due to severe arthritis. He has  Diffuse muscle/joint aches Skin: no rashes Neurological: no tremors/numbness/tingling/dizziness Psychiatric: no depression/anxiety  PE: BP 139/79 mmHg  Pulse 69  Wt 373 lb (169.192 kg)  SpO2 95% Wt Readings from Last 3 Encounters:  10/04/14 373 lb (169.192 kg)  08/18/14 365 lb 15.4 oz (166 kg)  08/08/14 366 lb (166.017 kg)   Constitutional: Morbidly obese at 373 lbs,  NAD Eyes: PERRLA, EOMI, no  exophthalmos ENT: moist mucous membranes, no thyromegaly, no cervical lymphadenopathy. Neck: + well healed post-thyroidectomy scar. No  Gross neck mass is palpable.  Cardiovascular: RRR, No MRG Respiratory: CTA B Gastrointestinal: abdomen soft, NT, ND, BS+ Musculoskeletal: walks with crutches . Skin: moist, warm, no rashes Neurological: no tremor with outstretched hands, DTR normal in all 4  ASSESSMENT: 1. 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 Postsurgical Hypothyroidism  PLAN:  1. 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 - I had a long discussion with the patient about his recent diagnosis of thyroid cancer. We reviewed together the pathology .  He has  stage  pT3, pN0  By TNM .   - since the tumor was > 1.5 cm, and to improve his chance of survival ,  radioactive iodine is indicated for post-op thyroid remnant ablation. I explained that the main role of this is to facilitate monitoring in the long run (by checking thyroglobulin).  Pt agrees to have this scheduled. He is advised to maintain high degree of compliance for therapy and follow up.  - I gave him  reading materials about thyroid cancer and  radioactive iodine treatment and the approximate timeline for this. We will  utilize rTSH  ( Thyrogen) without withdrawing thyroid hormone. This will be followed by post therapy Whole Body Scan (WBS).  - We will check a thyroglobulin and Tg antibodies on 5 th day starting from the day of the first rTSH injection.  2. Patient with h/o total thyroidectomy for cancer, now with iatrogenic hypothyroidism, on levothyroxine therapy. He will benefit from a higher dose of  LT4 . I will increase LT4 to 150 mcg , will repeat TFTs in 3 weeks. - We discussed about correct intake of levothyroxine, fasting, with water, separated by at least 30 minutes from breakfast, and separated by more than 4 hours from calcium, iron,  multivitamins, acid reflux medications (PPIs).  Pt is advised to return in  4 weeks for follow up.  Glade Lloyd, MD  Tel. [phone], Fax [phone]

## 2014-10-05 ENCOUNTER — Encounter: Payer: Self-pay | Admitting: "Endocrinology

## 2014-10-05 DIAGNOSIS — E669 Obesity, unspecified: Secondary | ICD-10-CM | POA: Insufficient documentation

## 2014-10-12 ENCOUNTER — Other Ambulatory Visit: Payer: Self-pay | Admitting: "Endocrinology

## 2014-10-12 DIAGNOSIS — C73 Malignant neoplasm of thyroid gland: Secondary | ICD-10-CM

## 2014-10-17 ENCOUNTER — Encounter (HOSPITAL_COMMUNITY): Payer: Medicare HMO

## 2014-10-17 ENCOUNTER — Encounter (HOSPITAL_COMMUNITY): Payer: Self-pay

## 2014-10-17 ENCOUNTER — Ambulatory Visit (HOSPITAL_COMMUNITY)
Admission: RE | Admit: 2014-10-17 | Discharge: 2014-10-17 | Disposition: A | Discharge status: Home / Self Care | Payer: Medicare HMO | Source: Ambulatory Visit | Attending: "Endocrinology | Admitting: "Endocrinology

## 2014-10-17 DIAGNOSIS — C73 Malignant neoplasm of thyroid gland: Secondary | ICD-10-CM | POA: Diagnosis not present

## 2014-10-17 DIAGNOSIS — E890 Postprocedural hypothyroidism: Secondary | ICD-10-CM | POA: Diagnosis not present

## 2014-10-17 MED ORDER — STERILE WATER FOR INJECTION IJ SOLN
INTRAMUSCULAR | Status: AC
  Filled 2014-10-17: qty 20

## 2014-10-17 MED ORDER — THYROTROPIN ALFA 1.1 MG IM SOLR
INTRAMUSCULAR | Status: AC
  Administered 2014-10-17: 0.9 mg via INTRAMUSCULAR
  Filled 2014-10-17: qty 0.9

## 2014-10-17 MED ORDER — THYROTROPIN ALFA 1.1 MG IM SOLR
0.9000 mg | INTRAMUSCULAR | Status: AC
  Administered 2014-10-17: 0.9 mg via INTRAMUSCULAR

## 2014-10-18 ENCOUNTER — Ambulatory Visit (HOSPITAL_COMMUNITY): Payer: Medicare Other

## 2014-10-18 ENCOUNTER — Encounter (HOSPITAL_COMMUNITY): Payer: Self-pay

## 2014-10-18 ENCOUNTER — Encounter (HOSPITAL_COMMUNITY)
Admission: RE | Admit: 2014-10-18 | Discharge: 2014-10-18 | Disposition: A | Discharge status: Home / Self Care | Payer: Medicare HMO | Source: Ambulatory Visit | Attending: "Endocrinology | Admitting: "Endocrinology

## 2014-10-18 DIAGNOSIS — C73 Malignant neoplasm of thyroid gland: Secondary | ICD-10-CM | POA: Diagnosis not present

## 2014-10-18 MED ORDER — THYROTROPIN ALFA 1.1 MG IM SOLR
0.9000 mg | INTRAMUSCULAR | Status: AC
  Administered 2014-10-18: 0.9 mg via INTRAMUSCULAR

## 2014-10-19 ENCOUNTER — Encounter (HOSPITAL_COMMUNITY): Payer: Medicare Other

## 2014-10-19 ENCOUNTER — Encounter (HOSPITAL_COMMUNITY)
Admission: RE | Admit: 2014-10-19 | Discharge: 2014-10-19 | Disposition: A | Discharge status: Home / Self Care | Payer: Medicare HMO | Source: Ambulatory Visit | Attending: "Endocrinology | Admitting: "Endocrinology

## 2014-10-19 ENCOUNTER — Encounter (HOSPITAL_COMMUNITY): Payer: Self-pay

## 2014-10-19 ENCOUNTER — Other Ambulatory Visit: Payer: Self-pay | Admitting: "Endocrinology

## 2014-10-19 ENCOUNTER — Other Ambulatory Visit (HOSPITAL_COMMUNITY)
Admission: RE | Admit: 2014-10-19 | Discharge: 2014-10-19 | Disposition: A | Discharge status: Home / Self Care | Payer: Medicare HMO | Source: Ambulatory Visit | Attending: "Endocrinology | Admitting: "Endocrinology

## 2014-10-19 DIAGNOSIS — C73 Malignant neoplasm of thyroid gland: Secondary | ICD-10-CM | POA: Insufficient documentation

## 2014-10-19 MED ORDER — SODIUM IODIDE I 131 CAPSULE
150.0000 | Freq: Once | INTRAVENOUS | Status: AC | PRN
  Administered 2014-10-19: 150 via ORAL

## 2014-10-20 LAB — THYROGLOBULIN ANTIBODY: Thyroglobulin Antibody: <1 IU/mL (ref 0.0–0.9)

## 2014-10-23 LAB — THYROGLOBULIN LEVEL

## 2014-10-29 ENCOUNTER — Encounter (HOSPITAL_COMMUNITY)
Admission: RE | Admit: 2014-10-29 | Discharge: 2014-10-29 | Disposition: A | Discharge status: Home / Self Care | Payer: Medicare HMO | Source: Ambulatory Visit | Attending: "Endocrinology | Admitting: "Endocrinology

## 2014-10-29 DIAGNOSIS — C73 Malignant neoplasm of thyroid gland: Secondary | ICD-10-CM | POA: Insufficient documentation

## 2014-11-02 ENCOUNTER — Ambulatory Visit (INDEPENDENT_AMBULATORY_CARE_PROVIDER_SITE_OTHER): Payer: Medicare HMO | Admitting: "Endocrinology

## 2014-11-02 ENCOUNTER — Encounter: Payer: Self-pay | Admitting: "Endocrinology

## 2014-11-02 VITALS — BP 154/71 | HR 61 | Ht 72.0 in | Wt 374.0 lb

## 2014-11-02 DIAGNOSIS — C73 Malignant neoplasm of thyroid gland: Secondary | ICD-10-CM | POA: Diagnosis not present

## 2014-11-02 DIAGNOSIS — E890 Postprocedural hypothyroidism: Secondary | ICD-10-CM | POA: Diagnosis not present

## 2014-11-02 NOTE — Progress Notes (Signed)
HPI  Jason Cohen is a 69 y.o.-year-old male, referred by his PCP, Dr.  Matthias Hughs  for management of thyroid cancer and postsurgical hypothyroidism.  Pt. has been dx with thyroid cancer in August 2016. He is status post near total thyroidectomy :  -  On 08/08/2014:  Right Thyroid, lobectomy which showed:  - FOLLICULAR (HURTHLE CELL) CARCINOMA. - LYMPHOVASCULAR INVASION PRESENT. - FOCALLY DISRUPTED AND CAUTERIZED MARGIN WITH ADJACENT TUMOR, SEE COMMENT. - ONE OF ONE LYMPH NODES NEGATIVE FOR CARCINOMA (0/1).  -  On 08/17/2014 : Left   Thyroid, lobectomy which showed:  - ADENOMATOUS NODULES. - NO INVOLVEMENT BY HURTHLE CELL CARCINOMA.   On 10/29/2014 He underwent WBS preceded by thyrogen x 2 and I131 dose. This showed tracer accumulation RIGHT cervical region consistent with thyroid remnant.  No scintigraphic evidence of iodine avid metastatic thyroid cancer. -His TG level <2 along with Tg antibody <1. Pt denies feeling nodules in neck, hoarseness, dysphagia/odynophagia, SOB with lying down.  For her postsurgical hypothyroidism, - He  Is on  LT4 150 mcg po qam. He reports compliance.  He has  No  FH of thyroid disorders nor  FH of thyroid cancer.  No h/o radiation tx to head or neck.  ROS: Constitutional: No weight gain, + fatigue, no subjective hyperthermia/hypothermia Eyes: no blurry vision, no xerophthalmia ENT: no sore throat, no nodules palpated in throat, no dysphagia/odynophagia, no hoarseness Cardiovascular: no CP/SOB/palpitations/leg swelling Respiratory: no cough/SOB Gastrointestinal: no N/V/D/C Musculoskeletal: Uses crutches to ambulate due to severe arthritis. He has  Diffuse muscle/joint aches Skin: no rashes Neurological: no tremors/numbness/tingling/dizziness Psychiatric: no depression/anxiety  PE: BP 154/71 mmHg  Pulse 61  Ht 6' (1.829 m)  Wt 374 lb (169.645 kg)  BMI 50.71 kg/m2  SpO2 96% Wt Readings from Last 3 Encounters:  11/02/14 374 lb  (169.645 kg)  10/04/14 373 lb (169.192 kg)  08/18/14 365 lb 15.4 oz (166 kg)   Constitutional: Morbidly obese at 373 lbs,  NAD Eyes: PERRLA, EOMI, no exophthalmos ENT: moist mucous membranes, no thyromegaly, no cervical lymphadenopathy. Neck: + well healed post-thyroidectomy scar. No  Gross neck mass is palpable.  Cardiovascular: RRR, No MRG Respiratory: CTA B Gastrointestinal: abdomen soft, NT, ND, BS+ Musculoskeletal: walks with crutches . Skin: moist, warm, no rashes Neurological: no tremor with outstretched hands, DTR normal in all 4  ASSESSMENT:  1. 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 Postsurgical Hypothyroidism  PLAN:  1. 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 S/P total thyroidectomy and Thyrogen stimulated i131 remnant ablation on 10/29/2014.  Tracer accumulation RIGHT cervical region consistent with thyroid remnant.  No scintigraphic evidence of iodine avid metastatic thyroid cancer. This will need surveillance imaging with ultrasound in 3 months.  2. Patient with h/o total thyroidectomy for cancer, now with iatrogenic hypothyroidism, on levothyroxine therapy. He will continue  LT4 150 mcg , will repeat TFTs in 3 moths.   - We discussed about correct intake of levothyroxine, fasting, with water, separated by at least 30 minutes from breakfast, and separated by more than 4 hours from calcium, iron, multivitamins, acid reflux medications (PPIs).  Pt is advised to return in  12 weeks for follow up with labs.  Glade Lloyd, MD  Tel. [phone], Fax [phone]

## 2014-11-02 NOTE — Patient Instructions (Signed)

## 2014-12-06 ENCOUNTER — Ambulatory Visit (INDEPENDENT_AMBULATORY_CARE_PROVIDER_SITE_OTHER): Payer: Medicare Other | Admitting: Otolaryngology

## 2015-01-03 ENCOUNTER — Other Ambulatory Visit: Payer: Self-pay | Admitting: "Endocrinology

## 2015-02-04 ENCOUNTER — Encounter: Payer: Self-pay | Admitting: "Endocrinology

## 2015-02-04 ENCOUNTER — Ambulatory Visit (INDEPENDENT_AMBULATORY_CARE_PROVIDER_SITE_OTHER): Payer: PPO | Admitting: "Endocrinology

## 2015-02-04 VITALS — BP 131/70 | HR 71 | Ht 72.0 in | Wt 370.0 lb

## 2015-02-04 DIAGNOSIS — E890 Postprocedural hypothyroidism: Secondary | ICD-10-CM | POA: Diagnosis not present

## 2015-02-04 DIAGNOSIS — C73 Malignant neoplasm of thyroid gland: Secondary | ICD-10-CM | POA: Diagnosis not present

## 2015-02-04 MED ORDER — LEVOTHYROXINE SODIUM 150 MCG PO TABS: 150.0000 ug | ORAL_TABLET | Freq: Every day | ORAL | Status: DC

## 2015-02-04 NOTE — Progress Notes (Signed)
HPI  Jason Cohen is a 70 y.o.-year-old male, referred by his PCP, Dr.  Matthias Hughs  for management of thyroid cancer and postsurgical hypothyroidism.  Pt. has been dx with thyroid cancer in August 2016. - He is status post near total thyroidectomy :  -  On 08/08/2014:  Right Thyroid, lobectomy which showed:  - FOLLICULAR (HURTHLE CELL) CARCINOMA. - LYMPHOVASCULAR INVASION PRESENT. - FOCALLY DISRUPTED AND CAUTERIZED MARGIN WITH ADJACENT TUMOR, SEE COMMENT. - ONE OF ONE LYMPH NODES NEGATIVE FOR CARCINOMA (0/1).  -  On 08/17/2014 : Left   Thyroid, lobectomy which showed:  - ADENOMATOUS NODULES. - NO INVOLVEMENT BY HURTHLE CELL CARCINOMA.   On 10/29/2014 He underwent WBS preceded by thyrogen x 2 and I131 dose. This showed tracer accumulation RIGHT cervical region consistent with thyroid remnant.  No scintigraphic evidence of iodine avid metastatic thyroid cancer. -His TG level <2 along with Tg antibody <1. Pt denies feeling nodules in neck, hoarseness, dysphagia/odynophagia, SOB with lying down.  For her postsurgical hypothyroidism, - He  Is on  LT4 150 mcg po qam. He reports compliance.  He has  No  FH of thyroid disorders nor  FH of thyroid cancer.  No h/o radiation tx to head or neck. -he was supposed to have neck and thyroid ultrasound before this visit, however, he did not show up for his ultrasound..  ROS: Constitutional: No weight gain, + fatigue, no subjective hyperthermia/hypothermia Eyes: no blurry vision, no xerophthalmia ENT: no sore throat, no nodules palpated in throat, no dysphagia/odynophagia, no hoarseness Cardiovascular: no CP/SOB/palpitations/leg swelling Respiratory: no cough/SOB Gastrointestinal: no N/V/D/C Musculoskeletal: Uses crutches to ambulate due to severe arthritis. He has  Diffuse muscle/joint aches Skin: no rashes Neurological: no tremors/numbness/tingling/dizziness Psychiatric: no depression/anxiety  PE: BP 131/70 mmHg  Pulse 71  Ht 6'  (1.829 m)  Wt 370 lb (167.831 kg)  BMI 50.17 kg/m2  SpO2 98% Wt Readings from Last 3 Encounters:  02/04/15 370 lb (167.831 kg)  11/02/14 374 lb (169.645 kg)  10/04/14 373 lb (169.192 kg)   Constitutional: Morbidly obese at 373 lbs,  NAD Eyes: PERRLA, EOMI, no exophthalmos ENT: moist mucous membranes, no thyromegaly, no cervical lymphadenopathy. Neck: + well healed post-thyroidectomy scar. No  Gross neck mass is palpable.  Cardiovascular: RRR, No MRG Respiratory: CTA B Gastrointestinal: abdomen soft, NT, ND, BS+ Musculoskeletal: walks with crutches . Skin: moist, warm, no rashes Neurological: no tremor with outstretched hands, DTR normal in all 4  ASSESSMENT:  1. 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 Postsurgical Hypothyroidism   PLAN:  1. 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 S/P total thyroidectomy and Thyrogen stimulated i131 remnant ablation on 10/29/2014.  Tracer accumulation RIGHT cervical region consistent with thyroid remnant.  No scintigraphic evidence of iodine avid metastatic thyroid cancer. This will need surveillance imaging with ultrasound in 3 months.  2. Patient with h/o total thyroidectomy for cancer, now with iatrogenic hypothyroidism, on levothyroxine therapy. He will continue  LT4 150 mcg , will repeat TFTs in 3 moths.   - We discussed about correct intake of levothyroxine, fasting, with water, separated by at least 30 minutes from breakfast, and separated by more than 4 hours from calcium, iron, multivitamins, acid reflux medications (PPIs).  Pt is advised to return in  12 weeks for follow up with labs.  Glade Lloyd, MD  Tel. [phone], Fax [phone]

## 2015-02-20 DIAGNOSIS — E890 Postprocedural hypothyroidism: Secondary | ICD-10-CM | POA: Diagnosis not present

## 2015-03-20 DIAGNOSIS — Z23 Encounter for immunization: Secondary | ICD-10-CM | POA: Diagnosis not present

## 2015-05-07 ENCOUNTER — Ambulatory Visit (HOSPITAL_COMMUNITY): Payer: PPO

## 2015-05-07 ENCOUNTER — Ambulatory Visit (HOSPITAL_COMMUNITY)
Admission: RE | Admit: 2015-05-07 | Discharge: 2015-05-07 | Disposition: A | Discharge status: Home / Self Care | Payer: PPO | Source: Ambulatory Visit | Attending: "Endocrinology | Admitting: "Endocrinology

## 2015-05-07 DIAGNOSIS — C73 Malignant neoplasm of thyroid gland: Secondary | ICD-10-CM | POA: Insufficient documentation

## 2015-05-07 DIAGNOSIS — Z85850 Personal history of malignant neoplasm of thyroid: Secondary | ICD-10-CM | POA: Diagnosis not present

## 2015-05-13 ENCOUNTER — Ambulatory Visit: Payer: PPO | Admitting: "Endocrinology

## 2015-05-14 DIAGNOSIS — Z0000 Encounter for general adult medical examination without abnormal findings: Secondary | ICD-10-CM | POA: Diagnosis not present

## 2015-05-14 DIAGNOSIS — M170 Bilateral primary osteoarthritis of knee: Secondary | ICD-10-CM | POA: Diagnosis not present

## 2015-05-14 DIAGNOSIS — E890 Postprocedural hypothyroidism: Secondary | ICD-10-CM | POA: Diagnosis not present

## 2015-05-14 DIAGNOSIS — E7800 Pure hypercholesterolemia, unspecified: Secondary | ICD-10-CM | POA: Diagnosis not present

## 2015-05-14 DIAGNOSIS — Z6841 Body Mass Index (BMI) 40.0 and over, adult: Secondary | ICD-10-CM | POA: Diagnosis not present

## 2015-05-15 ENCOUNTER — Encounter: Payer: Self-pay | Admitting: "Endocrinology

## 2015-05-15 ENCOUNTER — Ambulatory Visit (INDEPENDENT_AMBULATORY_CARE_PROVIDER_SITE_OTHER): Payer: PPO | Admitting: "Endocrinology

## 2015-05-15 VITALS — BP 118/65 | HR 71 | Ht 72.0 in | Wt 378.0 lb

## 2015-05-15 DIAGNOSIS — C73 Malignant neoplasm of thyroid gland: Secondary | ICD-10-CM

## 2015-05-15 DIAGNOSIS — E890 Postprocedural hypothyroidism: Secondary | ICD-10-CM

## 2015-05-15 MED ORDER — LEVOTHYROXINE SODIUM 150 MCG PO TABS: 150.0000 ug | ORAL_TABLET | Freq: Every day | ORAL | Status: AC

## 2015-05-15 NOTE — Progress Notes (Signed)
HPI  Jason Cohen is a 70 y.o.-year-old male, referred by his PCP, Dr.  Matthias Cohen  for management of thyroid cancer and postsurgical hypothyroidism.  Pt. has been dx with thyroid cancer in August 2016. - He is status post near total thyroidectomy :  -  On 08/08/2014:  Right Thyroid, lobectomy which showed:  - FOLLICULAR (HURTHLE CELL) CARCINOMA. - LYMPHOVASCULAR INVASION PRESENT. - FOCALLY DISRUPTED AND CAUTERIZED MARGIN WITH ADJACENT TUMOR, SEE COMMENT. - ONE OF ONE LYMPH NODES NEGATIVE FOR CARCINOMA (0/1).  -  On 08/17/2014 : Left   Thyroid, lobectomy which showed:  - ADENOMATOUS NODULES. - NO INVOLVEMENT BY HURTHLE CELL CARCINOMA.   On 10/29/2014 He underwent WBS preceded by thyrogen x 2 and I131 dose. This showed tracer accumulation RIGHT cervical region consistent with thyroid remnant.  No scintigraphic evidence of iodine avid metastatic thyroid cancer. -His TG level <2 along with Tg antibody <1. Pt denies feeling nodules in neck, hoarseness, dysphagia/odynophagia, SOB with lying down.  For her postsurgical hypothyroidism, - He  Is on  LT4 150 mcg po qam. He reports compliance.  He has  No  FH of thyroid disorders nor  FH of thyroid cancer.  No h/o radiation tx to head or neck. -he was supposed to have neck and thyroid ultrasound before this visit, however, he did not show up for his ultrasound..  ROS: Constitutional: No weight gain, + fatigue, no subjective hyperthermia/hypothermia Eyes: no blurry vision, no xerophthalmia ENT: no sore throat, no nodules palpated in throat, no dysphagia/odynophagia, no hoarseness Cardiovascular: no CP/SOB/palpitations/leg swelling Respiratory: no cough/SOB Gastrointestinal: no N/V/D/C Musculoskeletal: Uses crutches to ambulate due to severe arthritis. He has  Diffuse muscle/joint aches Skin: no rashes Neurological: no tremors/numbness/tingling/dizziness Psychiatric: no depression/anxiety  PE: BP 118/65 mmHg  Pulse 71  Ht 6'  (1.829 m)  Wt 378 lb (171.46 kg)  BMI 51.25 kg/m2  SpO2 96% Wt Readings from Last 3 Encounters:  05/15/15 378 lb (171.46 kg)  02/04/15 370 lb (167.831 kg)  11/02/14 374 lb (169.645 kg)   Constitutional: Morbidly obese at 373 lbs,  NAD Eyes: PERRLA, EOMI, no exophthalmos ENT: moist mucous membranes, no thyromegaly, no cervical lymphadenopathy. Neck: + well healed post-thyroidectomy scar. No  Gross neck mass is palpable.  Cardiovascular: RRR, No MRG Respiratory: CTA B Gastrointestinal: abdomen soft, NT, ND, BS+ Musculoskeletal: walks with crutches . Skin: moist, warm, no rashes Neurological: no tremor with outstretched hands, DTR normal in all 4   -Thyroid/neck ultrasound from 05/07/2015 showed surgically absent thyroid/thyroid remnants/lymphadenopathy.  ASSESSMENT:  1. 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 Postsurgical Hypothyroidism   PLAN:  1. 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 S/P total thyroidectomy and Thyrogen stimulated i131 remnant ablation on 10/29/2014. Tracer accumulation RIGHT cervical region consistent with thyroid remnant.  No scintigraphic evidence of iodine avid metastatic thyroid cancer. - surveillance imaging with ultrasound is unremarkable on 05/07/2015. - He will have thyroglobulin level along with his next blood work in 3 months.  2. Patient with h/o total thyroidectomy for cancer, now with iatrogenic hypothyroidism, on levothyroxine therapy. He will continue  LT4 150 mcg , will repeat TFTs in 3 moths.   - We discussed about correct intake of levothyroxine, fasting, with water, separated by at least 30 minutes from breakfast, and separated by more than 4 hours from calcium, iron, multivitamins, acid reflux medications (PPIs).  Pt  is advised to return in  12 weeks for follow up with labs.  Jason Lloyd, MD  Tel. [phone], Fax  [phone]

## 2015-08-12 DIAGNOSIS — C73 Malignant neoplasm of thyroid gland: Secondary | ICD-10-CM | POA: Diagnosis not present

## 2015-08-12 DIAGNOSIS — E890 Postprocedural hypothyroidism: Secondary | ICD-10-CM | POA: Diagnosis not present

## 2015-08-12 DIAGNOSIS — L729 Follicular cyst of the skin and subcutaneous tissue, unspecified: Secondary | ICD-10-CM | POA: Diagnosis not present

## 2015-08-16 ENCOUNTER — Ambulatory Visit: Payer: PPO | Admitting: "Endocrinology

## 2015-11-14 DIAGNOSIS — M170 Bilateral primary osteoarthritis of knee: Secondary | ICD-10-CM | POA: Diagnosis not present

## 2015-11-14 DIAGNOSIS — I872 Venous insufficiency (chronic) (peripheral): Secondary | ICD-10-CM | POA: Diagnosis not present

## 2015-11-14 DIAGNOSIS — Z23 Encounter for immunization: Secondary | ICD-10-CM | POA: Diagnosis not present

## 2015-11-14 DIAGNOSIS — E890 Postprocedural hypothyroidism: Secondary | ICD-10-CM | POA: Diagnosis not present

## 2016-04-06 DIAGNOSIS — I872 Venous insufficiency (chronic) (peripheral): Secondary | ICD-10-CM | POA: Diagnosis not present

## 2016-04-06 DIAGNOSIS — M170 Bilateral primary osteoarthritis of knee: Secondary | ICD-10-CM | POA: Diagnosis not present

## 2016-04-06 DIAGNOSIS — M79671 Pain in right foot: Secondary | ICD-10-CM | POA: Diagnosis not present

## 2016-04-06 DIAGNOSIS — L989 Disorder of the skin and subcutaneous tissue, unspecified: Secondary | ICD-10-CM | POA: Diagnosis not present

## 2016-05-28 DIAGNOSIS — L089 Local infection of the skin and subcutaneous tissue, unspecified: Secondary | ICD-10-CM | POA: Diagnosis not present

## 2016-06-18 DIAGNOSIS — H04123 Dry eye syndrome of bilateral lacrimal glands: Secondary | ICD-10-CM | POA: Diagnosis not present

## 2016-08-03 DIAGNOSIS — R5381 Other malaise: Secondary | ICD-10-CM | POA: Diagnosis not present

## 2016-08-03 DIAGNOSIS — R5383 Other fatigue: Secondary | ICD-10-CM | POA: Diagnosis not present

## 2016-08-03 DIAGNOSIS — I872 Venous insufficiency (chronic) (peripheral): Secondary | ICD-10-CM | POA: Diagnosis not present

## 2016-08-03 DIAGNOSIS — E890 Postprocedural hypothyroidism: Secondary | ICD-10-CM | POA: Diagnosis not present

## 2016-10-13 ENCOUNTER — Emergency Department (HOSPITAL_COMMUNITY)
Admission: EM | Admit: 2016-10-13 | Discharge: 2016-10-13 | Disposition: A | Discharge status: Home / Self Care | Payer: PPO | Attending: Emergency Medicine | Admitting: Emergency Medicine

## 2016-10-13 ENCOUNTER — Encounter (HOSPITAL_COMMUNITY): Payer: Self-pay

## 2016-10-13 DIAGNOSIS — Y929 Unspecified place or not applicable: Secondary | ICD-10-CM | POA: Diagnosis not present

## 2016-10-13 DIAGNOSIS — Z79899 Other long term (current) drug therapy: Secondary | ICD-10-CM | POA: Insufficient documentation

## 2016-10-13 DIAGNOSIS — W548XXA Other contact with dog, initial encounter: Secondary | ICD-10-CM | POA: Diagnosis not present

## 2016-10-13 DIAGNOSIS — Z23 Encounter for immunization: Secondary | ICD-10-CM | POA: Diagnosis not present

## 2016-10-13 DIAGNOSIS — Y998 Other external cause status: Secondary | ICD-10-CM | POA: Insufficient documentation

## 2016-10-13 DIAGNOSIS — Z87891 Personal history of nicotine dependence: Secondary | ICD-10-CM | POA: Insufficient documentation

## 2016-10-13 DIAGNOSIS — Y9389 Activity, other specified: Secondary | ICD-10-CM | POA: Insufficient documentation

## 2016-10-13 DIAGNOSIS — S61215A Laceration without foreign body of left ring finger without damage to nail, initial encounter: Secondary | ICD-10-CM | POA: Diagnosis not present

## 2016-10-13 MED ORDER — POVIDONE-IODINE 10 % EX SOLN
CUTANEOUS | Status: AC
  Administered 2016-10-13: 1 via TOPICAL
  Filled 2016-10-13: qty 15

## 2016-10-13 MED ORDER — TETANUS-DIPHTH-ACELL PERTUSSIS 5-2.5-18.5 LF-MCG/0.5 IM SUSP
0.5000 mL | Freq: Once | INTRAMUSCULAR | Status: AC
  Administered 2016-10-13: 0.5 mL via INTRAMUSCULAR
  Filled 2016-10-13: qty 0.5

## 2016-10-13 MED ORDER — POVIDONE-IODINE 10 % EX SOLN
CUTANEOUS | Status: DC | PRN
  Administered 2016-10-13: 1 via TOPICAL

## 2016-10-13 MED ORDER — LIDOCAINE HCL (PF) 1 % IJ SOLN
INTRAMUSCULAR | Status: AC
  Filled 2016-10-13: qty 5

## 2016-10-13 MED ORDER — LIDOCAINE HCL (PF) 1 % IJ SOLN
5.0000 mL | Freq: Once | INTRAMUSCULAR | Status: AC
  Administered 2016-10-13: 5 mL via INTRADERMAL

## 2016-10-13 NOTE — ED Provider Notes (Signed)
[facility] DEPT Provider Note   CSN: [id number] Arrival date & time: 10/13/16  1019     History   Chief Complaint Chief Complaint  Patient presents with  . Laceration    HPI Jason Cohen is a 71 y.o. male.  Patient is a 71 year old male who presents to the emergency department with a complaint of laceration to the left ring finger.  The patient states that he was walking his dog on a leash. The leash wrapped around his ring finger and when the dog lunged he sustained an injury. The patient is unsure of the date of his last tetanus. He denies being on any anticoagulation medications. He has not had any recent operations or procedures involving the left hand. No other injuries were reported at this time.   The history is provided by the patient.    Past Medical History:  Diagnosis Date  . Arthritis    "just in my knees" (08/17/2014)  . Hypothyroidism   . Obesity (BMI 35.0-39.9 without comorbidity)   . Shortness of breath dyspnea    when sleeping on back  . Thyroid cancer ([place])    S/P thyroidectomy  . Thyroid nodule 2016   right    Patient Active Problem List   Diagnosis Date Noted  . Malignant neoplasm of thyroid gland ([place]) 10/04/2014  . Postsurgical hypothyroidism 10/04/2014  . S/P complete thyroidectomy 08/17/2014  . S/P partial thyroidectomy 08/08/2014    Past Surgical History:  Procedure Laterality Date  . FINGER AMPUTATION Left 1980's   Tip of middle finger; "got it caught in a machine"  . THYROIDECTOMY Right 08/08/2014   Procedure: RIGHT HEMI THYROIDECTOMY;  Surgeon: Leta Baptist, MD;  Location: [facility];  Service: ENT;  Laterality: Right;  . THYROIDECTOMY N/A 08/17/2014   Procedure: COMPLETION THYROIDECTOMY;  Surgeon: Leta Baptist, MD;  Location: [facility];  Service: ENT;  Laterality: N/A;  . TOTAL THYROIDECTOMY  08/17/2014       Home Medications    Prior to Admission medications   Medication Sig Start Date End Date Taking? Authorizing Provider    calcium-vitamin D (OSCAL WITH D) 500-200 MG-UNIT per tablet Take 2 tablets by mouth 2 (two) times daily. Patient not taking: Reported on 02/04/2015 08/18/14   Leta Baptist, MD  levothyroxine (SYNTHROID, LEVOTHROID) 150 MCG tablet Take 1 tablet (150 mcg total) by mouth daily before breakfast. 05/15/15   Nida, Marella Chimes, MD  oxyCODONE-acetaminophen (PERCOCET/ROXICET) 5-325 MG per tablet Take 1-2 tablets by mouth every 4 (four) hours as needed for severe pain. 08/18/14   Leta Baptist, MD    Family History Family History  Problem Relation Age of Onset  . Diabetes Mother     Social History Social History  Substance Use Topics  . Smoking status: Former Smoker    Packs/day: 2.00    Types: Cigarettes  . Smokeless tobacco: Never Used     Comment: "stopped smoking in ~ 1980"  . Alcohol use No     Allergies   Patient has no known allergies.   Review of Systems Review of Systems  Constitutional: Negative for activity change.       All ROS Neg except as noted in HPI  HENT: Negative for nosebleeds.   Eyes: Negative for photophobia and discharge.  Respiratory: Negative for cough, shortness of breath and wheezing.   Cardiovascular: Negative for chest pain and palpitations.  Gastrointestinal: Negative for abdominal pain and blood in stool.  Genitourinary: Negative for dysuria, frequency and hematuria.  Musculoskeletal: Positive for  arthralgias. Negative for back pain and neck pain.  Skin: Negative.   Neurological: Negative for dizziness, seizures and speech difficulty.  Psychiatric/Behavioral: Negative for confusion and hallucinations.     Physical Exam Updated Vital Signs BP (!) 144/86 (BP Location: Right Arm)   Pulse 69   Temp 98 F (36.7 C) (Oral)   Resp 20   Ht 6' (1.829 m)   Wt (!) 161 kg (355 lb)   SpO2 99%   BMI 48.15 kg/m   Physical Exam  Constitutional: He is oriented to person, place, and time. He appears well-developed and well-nourished.  Non-toxic appearance.   HENT:  Head: Normocephalic.  Right Ear: Tympanic membrane and external ear normal.  Left Ear: Tympanic membrane and external ear normal.  Eyes: Pupils are equal, round, and reactive to light. EOM and lids are normal.  Neck: Normal range of motion. Neck supple. Carotid bruit is not present.  Cardiovascular: Normal rate, regular rhythm, normal heart sounds, intact distal pulses and normal pulses.   Pulmonary/Chest: Breath sounds normal. No respiratory distress.  Abdominal: Soft. Bowel sounds are normal. There is no tenderness. There is no guarding.  Musculoskeletal: Normal range of motion.  3.2cm laceration left ring finger. No bone or tendon involvement.  Lymphadenopathy:       Head (right side): No submandibular adenopathy present.       Head (left side): No submandibular adenopathy present.    He has no cervical adenopathy.  Neurological: He is alert and oriented to person, place, and time. He has normal strength. No cranial nerve deficit or sensory deficit.  Skin: Skin is warm and dry.  Psychiatric: He has a normal mood and affect. His speech is normal.  Nursing note and vitals reviewed.    ED Treatments / Results  Labs (all labs ordered are listed, but only abnormal results are displayed) Labs Reviewed - No data to display  EKG  EKG Interpretation None       Radiology No results found.  Procedures .Marland KitchenLaceration Repair Date/Time: 10/13/2016 12:40 PM Performed by: Lily Kocher Authorized by: Lily Kocher   Consent:    Consent obtained:  Verbal   Consent given by:  Patient   Risks discussed:  Infection, pain, poor cosmetic result and poor wound healing Anesthesia (see MAR for exact dosages):    Anesthesia method:  Nerve block   Block location:  Left ring finger   Block needle gauge:  25 G   Block anesthetic:  Lidocaine 1% w/o epi   Block technique:  Digital   Block injection procedure:  Anatomic landmarks identified, introduced needle, incremental injection,  anatomic landmarks palpated and negative aspiration for blood   Block outcome:  Anesthesia achieved Laceration details:    Location:  Finger   Finger location:  L ring finger   Length (cm):  3.2 Repair type:    Repair type:  Simple Pre-procedure details:    Preparation:  Patient was prepped and draped in usual sterile fashion Exploration:    Hemostasis achieved with:  Direct pressure   Wound exploration: wound explored through full range of motion and entire depth of wound probed and visualized     Wound extent: no foreign bodies/material noted, no tendon damage noted and no underlying fracture noted   Treatment:    Area cleansed with:  Betadine   Irrigation solution:  Sterile saline Skin repair:    Repair method:  Sutures   Suture size:  4-0   Suture material:  Prolene   Number of  sutures:  9 Approximation:    Approximation:  Close Post-procedure details:    Dressing:  Sterile dressing   Patient tolerance of procedure:  Tolerated well, no immediate complications   (including critical care time)  Medications Ordered in ED Medications  povidone-iodine (BETADINE) 10 % external solution (not administered)  lidocaine (PF) (XYLOCAINE) 1 % injection (not administered)  povidone-iodine (BETADINE) 10 % external solution (1 application  Given 45/0/38 1140)  Tdap (BOOSTRIX) injection 0.5 mL (0.5 mLs Intramuscular Given 10/13/16 1140)  lidocaine (PF) (XYLOCAINE) 1 % injection 5 mL (5 mLs Intradermal Given 10/13/16 1140)     Initial Impression / Assessment and Plan / ED Course  I have reviewed the triage vital signs and the nursing notes.  Pertinent labs & imaging results that were available during my care of the patient were reviewed by me and considered in my medical decision making (see chart for details).       Final Clinical Impressions(s) / ED Diagnoses MDM Patient sustained a 3.2 cm laceration to the left ring finger as a result of dog leash The wound was repaired with 9  interrupted sutures of Prolene. The patient's tetanus status was updated. The patient isplaced on antibiotic therapy because of the location and the depth of the wound. The patient is to have the sutures removed in 7-10 days.   Final diagnoses:  Laceration of left ring finger without foreign body without damage to nail, initial encounter    New Prescriptions Discharge Medication List as of 10/13/2016 12:44 PM       Lily Kocher, PA-C 10/14/16 1644    Milton Ferguson, MD 10/16/16 [phone]

## 2016-10-13 NOTE — ED Triage Notes (Signed)
Pt was walking his dog and the dog pulled from him and leash cut his left ring finger.

## 2016-10-13 NOTE — Discharge Instructions (Signed)
Please keep the wound clean and dry. Have the sutures removed in 7 or 8 days. Return if any signs of infection.

## 2016-10-14 NOTE — Patient Outreach (Signed)
Spoke with patient and patient verified PCP.  I explained [facility] services and 24 Hour Nurse Advice Line.  Patient made note of the 24 Hour Nurse Advice Line number.  I asked patient if he would like a follow up call from one of my team members to go over services in detail and if so I needed to ask a few questions.  He said not right now he wanted to get the paperwork first and he will call back if he needs our services.

## 2017-03-15 DIAGNOSIS — Z6841 Body Mass Index (BMI) 40.0 and over, adult: Secondary | ICD-10-CM | POA: Diagnosis not present

## 2017-03-15 DIAGNOSIS — M170 Bilateral primary osteoarthritis of knee: Secondary | ICD-10-CM | POA: Diagnosis not present

## 2017-04-29 DIAGNOSIS — Z79899 Other long term (current) drug therapy: Secondary | ICD-10-CM | POA: Diagnosis not present

## 2017-06-07 IMAGING — NM NM [ID] THYROID CANCER METS SP CA TX
4 series · 4 of 4 positions shown · non-contrast
Comparison: None.

CLINICAL DATA: Follicular type thyroid cancer post near total
thyroidectomy and postoperative radioactive iodine therapy

EXAM:
NUCLEAR MEDICINE R-6E6 POST THERAPY WHOLE BODY SCAN
TECHNIQUE: The patient received 150 mCi R-6E6 sodium iodide for the treatment
of thyroid cancer within the past 10 days. The patient returns
today, and whole body scanning was performed in the anterior and
posterior projections.

[Series 1: i131 whole body · 2.66mm/px · 1 of 1 slices shown (1 of 2)]
[im 1/1]
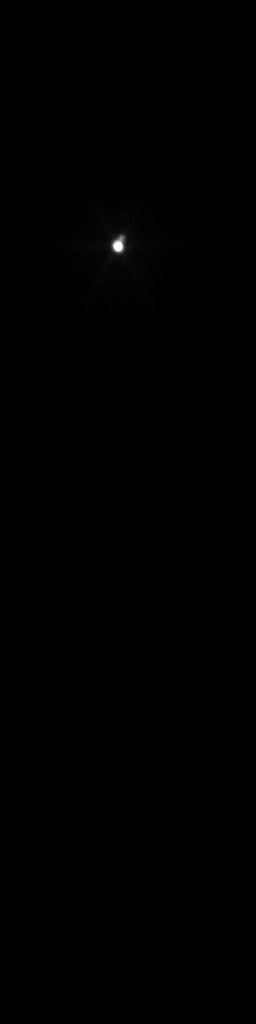

[Series 1: i131 whole body · 2.66mm/px · 1 of 1 slices shown (2 of 2)]
[im 1/1]
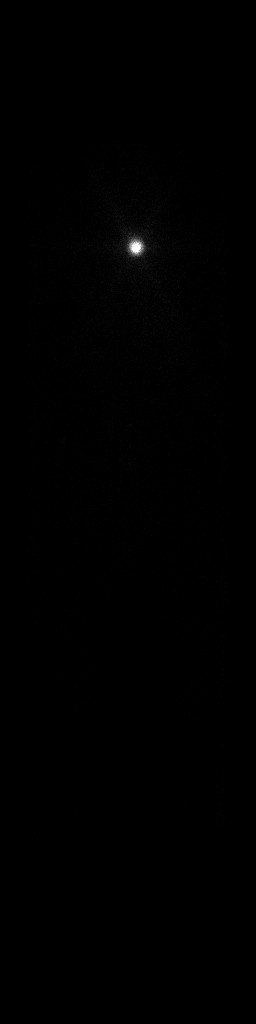

[Series 2: static with marker · 4.14mm/px · 1 of 1 slices shown]
[im 1/1]
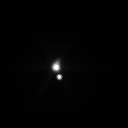

[Series 3: static without marker · non-contrast · 4.14mm/px · 1 of 1 slices shown]
[im 1/1]
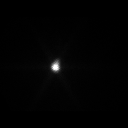

[4 of 4 positions shown; findings below may reference images not displayed]

FINDINGS: Residual tracer accumulation within RIGHT cervical region compatible
with thyroid remnant.

No additional sites of abnormal radio iodine accumulation identified
to suggest metastatic disease.

Minimal tracer within urinary bladder.
IMPRESSION: Tracer accumulation RIGHT cervical region consistent with thyroid
remnant.

No scintigraphic evidence of iodine avid metastatic thyroid cancer.

## 2017-06-23 DIAGNOSIS — M170 Bilateral primary osteoarthritis of knee: Secondary | ICD-10-CM | POA: Diagnosis not present

## 2017-06-23 DIAGNOSIS — M545 Low back pain: Secondary | ICD-10-CM | POA: Diagnosis not present

## 2017-06-23 DIAGNOSIS — M9983 Other biomechanical lesions of lumbar region: Secondary | ICD-10-CM | POA: Diagnosis not present

## 2017-06-23 DIAGNOSIS — G894 Chronic pain syndrome: Secondary | ICD-10-CM | POA: Diagnosis not present

## 2017-06-23 DIAGNOSIS — M47816 Spondylosis without myelopathy or radiculopathy, lumbar region: Secondary | ICD-10-CM | POA: Diagnosis not present

## 2017-06-23 DIAGNOSIS — M5136 Other intervertebral disc degeneration, lumbar region: Secondary | ICD-10-CM | POA: Diagnosis not present

## 2017-08-04 DIAGNOSIS — M545 Low back pain: Secondary | ICD-10-CM | POA: Diagnosis not present

## 2017-08-04 DIAGNOSIS — M5136 Other intervertebral disc degeneration, lumbar region: Secondary | ICD-10-CM | POA: Diagnosis not present

## 2017-08-04 DIAGNOSIS — M47816 Spondylosis without myelopathy or radiculopathy, lumbar region: Secondary | ICD-10-CM | POA: Diagnosis not present

## 2017-08-04 DIAGNOSIS — M170 Bilateral primary osteoarthritis of knee: Secondary | ICD-10-CM | POA: Diagnosis not present

## 2017-08-26 DIAGNOSIS — M170 Bilateral primary osteoarthritis of knee: Secondary | ICD-10-CM | POA: Diagnosis not present

## 2017-11-24 DIAGNOSIS — Z13220 Encounter for screening for lipoid disorders: Secondary | ICD-10-CM | POA: Diagnosis not present

## 2017-11-24 DIAGNOSIS — Z0000 Encounter for general adult medical examination without abnormal findings: Secondary | ICD-10-CM | POA: Diagnosis not present

## 2017-11-24 DIAGNOSIS — Z136 Encounter for screening for cardiovascular disorders: Secondary | ICD-10-CM | POA: Diagnosis not present

## 2017-11-24 DIAGNOSIS — Z7289 Other problems related to lifestyle: Secondary | ICD-10-CM | POA: Diagnosis not present

## 2017-11-24 DIAGNOSIS — R7302 Impaired glucose tolerance (oral): Secondary | ICD-10-CM | POA: Diagnosis not present

## 2017-11-24 DIAGNOSIS — E890 Postprocedural hypothyroidism: Secondary | ICD-10-CM | POA: Diagnosis not present

## 2018-05-25 DIAGNOSIS — E890 Postprocedural hypothyroidism: Secondary | ICD-10-CM | POA: Diagnosis not present

## 2018-05-25 DIAGNOSIS — M170 Bilateral primary osteoarthritis of knee: Secondary | ICD-10-CM | POA: Diagnosis not present

## 2018-10-31 DIAGNOSIS — Z23 Encounter for immunization: Secondary | ICD-10-CM | POA: Diagnosis not present

## 2019-03-20 DIAGNOSIS — Z5181 Encounter for therapeutic drug level monitoring: Secondary | ICD-10-CM | POA: Diagnosis not present

## 2019-03-20 DIAGNOSIS — Z13220 Encounter for screening for lipoid disorders: Secondary | ICD-10-CM | POA: Diagnosis not present

## 2019-03-20 DIAGNOSIS — E890 Postprocedural hypothyroidism: Secondary | ICD-10-CM | POA: Diagnosis not present

## 2019-03-20 DIAGNOSIS — Z0000 Encounter for general adult medical examination without abnormal findings: Secondary | ICD-10-CM | POA: Diagnosis not present

## 2019-03-20 DIAGNOSIS — Z136 Encounter for screening for cardiovascular disorders: Secondary | ICD-10-CM | POA: Diagnosis not present

## 2019-03-20 DIAGNOSIS — R7302 Impaired glucose tolerance (oral): Secondary | ICD-10-CM | POA: Diagnosis not present

## 2019-03-20 DIAGNOSIS — Z125 Encounter for screening for malignant neoplasm of prostate: Secondary | ICD-10-CM | POA: Diagnosis not present

## 2019-05-22 DIAGNOSIS — H8309 Labyrinthitis, unspecified ear: Secondary | ICD-10-CM | POA: Diagnosis not present

## 2019-08-22 DIAGNOSIS — R5382 Chronic fatigue, unspecified: Secondary | ICD-10-CM | POA: Diagnosis not present

## 2019-08-30 DIAGNOSIS — R5382 Chronic fatigue, unspecified: Secondary | ICD-10-CM | POA: Diagnosis not present

## 2019-09-06 DIAGNOSIS — E538 Deficiency of other specified B group vitamins: Secondary | ICD-10-CM | POA: Diagnosis not present

## 2019-09-13 DIAGNOSIS — E538 Deficiency of other specified B group vitamins: Secondary | ICD-10-CM | POA: Diagnosis not present

## 2019-09-21 DIAGNOSIS — E538 Deficiency of other specified B group vitamins: Secondary | ICD-10-CM | POA: Diagnosis not present

## 2019-09-27 DIAGNOSIS — E538 Deficiency of other specified B group vitamins: Secondary | ICD-10-CM | POA: Diagnosis not present

## 2020-03-28 DIAGNOSIS — Z13220 Encounter for screening for lipoid disorders: Secondary | ICD-10-CM | POA: Diagnosis not present

## 2020-03-28 DIAGNOSIS — Z136 Encounter for screening for cardiovascular disorders: Secondary | ICD-10-CM | POA: Diagnosis not present

## 2020-03-28 DIAGNOSIS — Z0000 Encounter for general adult medical examination without abnormal findings: Secondary | ICD-10-CM | POA: Diagnosis not present

## 2020-03-28 DIAGNOSIS — Z5181 Encounter for therapeutic drug level monitoring: Secondary | ICD-10-CM | POA: Diagnosis not present

## 2020-03-28 DIAGNOSIS — R234 Changes in skin texture: Secondary | ICD-10-CM | POA: Diagnosis not present

## 2020-03-28 DIAGNOSIS — Z125 Encounter for screening for malignant neoplasm of prostate: Secondary | ICD-10-CM | POA: Diagnosis not present

## 2020-03-28 DIAGNOSIS — E890 Postprocedural hypothyroidism: Secondary | ICD-10-CM | POA: Diagnosis not present

## 2020-03-28 DIAGNOSIS — E538 Deficiency of other specified B group vitamins: Secondary | ICD-10-CM | POA: Diagnosis not present

## 2020-03-28 DIAGNOSIS — R7302 Impaired glucose tolerance (oral): Secondary | ICD-10-CM | POA: Diagnosis not present

## 2020-04-05 DIAGNOSIS — H5203 Hypermetropia, bilateral: Secondary | ICD-10-CM | POA: Diagnosis not present

## 2020-05-15 DIAGNOSIS — E890 Postprocedural hypothyroidism: Secondary | ICD-10-CM | POA: Diagnosis not present

## 2020-07-22 DIAGNOSIS — Z23 Encounter for immunization: Secondary | ICD-10-CM | POA: Diagnosis not present

## 2020-09-23 DIAGNOSIS — R519 Headache, unspecified: Secondary | ICD-10-CM | POA: Diagnosis not present

## 2020-09-23 DIAGNOSIS — Z20822 Contact with and (suspected) exposure to covid-19: Secondary | ICD-10-CM | POA: Diagnosis not present

## 2020-09-23 DIAGNOSIS — R059 Cough, unspecified: Secondary | ICD-10-CM | POA: Diagnosis not present

## 2020-12-17 DIAGNOSIS — H9311 Tinnitus, right ear: Secondary | ICD-10-CM | POA: Diagnosis not present

## 2020-12-17 DIAGNOSIS — H9191 Unspecified hearing loss, right ear: Secondary | ICD-10-CM | POA: Diagnosis not present

## 2020-12-26 ENCOUNTER — Other Ambulatory Visit: Payer: Self-pay

## 2020-12-26 ENCOUNTER — Ambulatory Visit: Payer: PPO | Attending: Family Medicine | Admitting: Audiology

## 2020-12-26 DIAGNOSIS — H903 Sensorineural hearing loss, bilateral: Secondary | ICD-10-CM | POA: Insufficient documentation

## 2020-12-26 DIAGNOSIS — H9311 Tinnitus, right ear: Secondary | ICD-10-CM | POA: Diagnosis present

## 2020-12-26 NOTE — Procedures (Signed)
°  [facility] [place], [place]  [address] [phone]  AUDIOLOGICAL  EVALUATION  NAME: Jason Cohen     DOB:   [birth year]      MRN: [id number]                                                                                     DATE: 12/26/2020     REFERENT: Deloria Lair., MD STATUS: Outpatient DIAGNOSIS: Sensorineural hearing loss, bilateral   History: Min was seen for an audiological evaluation due to a sudden decreased in his hearing sensitivity in the right ear starting 2 weeks ago. He reports the sudden decrease in right hearing was accompanied by tinnitus which he described as "roaring" tinnitus. He denies otalgia, aural fullness and dizziness. He denies hearing concerns prior to the sudden drop in hearing. [name] has a history of noise exposure from working in a [employer] for many years.   Evaluation:  Otoscopy showed a clear view of the tympanic membranes, bilaterally Tympanometry results were consistent with normal middle ear pressure and normal tympanic membrane mobility, bilaterally.  Audiometric testing was completed using Conventional Audiometry techniques with insert earphones and TDH headphones. Test results are consistent with a moderate to moderately severe sensorineural hearing loss in the right tear and normal hearing sloping to a severe sensorineural hearing loss in the left ear. Sensorineural asymmetry noted at 250-750 Hz, worse in the right ear and 1500-3000 Hz and 8000 Hz worse in the left ear. Speech Recognition Thresholds were obtained at 40 dB HL in the right ear and at 30  dB HL in the left ear. Word Recognition Testing was completed at 80 dB HL in the right ear and [name] scored 80% and at 75 dB HL in the left ear and [name] scored 80%.    Results:  Today's test results are consistent with a moderate to moderately severe sensorineural hearing loss in the right tear and normal hearing sloping to a severe  sensorineural hearing loss in the left ear. Sensorineural asymmetry noted at 250-750 Hz, worse in the right ear and 1500-3000 Hz and 8000 Hz worse in the left ear. This degree of hearing loss will cause communication difficulty in all listening environments. He will benefit from the use of good communication strategies, amplification, and devices that decrease the signal to noise ratio. It is also recommended for [name] to be evaluated by an ENT for his sudden asymmetric hearing loss. The test results were reviewed with [name].   Recommendations: Referral to an Ear, Nose, and Throat Physician for right sudden sensorineural hearing loss, tinnitus, and asymmetric hearing Communication needs assessment with an Audiologist to discuss amplification and hearing aids if motivated.    If you have any questions please feel free to contact me at [phone].  Bari Mantis Audiologist, Au.D., CCC-A 12/26/2020  4:10 PM  Cc: Deloria Lair., MD

## 2021-02-21 ENCOUNTER — Other Ambulatory Visit: Payer: Self-pay | Admitting: Otolaryngology

## 2021-02-21 DIAGNOSIS — H903 Sensorineural hearing loss, bilateral: Secondary | ICD-10-CM

## 2021-02-21 DIAGNOSIS — H9113 Presbycusis, bilateral: Secondary | ICD-10-CM

## 2021-02-21 DIAGNOSIS — K219 Gastro-esophageal reflux disease without esophagitis: Secondary | ICD-10-CM

## 2021-03-17 ENCOUNTER — Ambulatory Visit
Admission: RE | Admit: 2021-03-17 | Discharge: 2021-03-17 | Disposition: A | Discharge status: Home / Self Care | Payer: PPO | Source: Ambulatory Visit | Attending: Otolaryngology | Admitting: Otolaryngology

## 2021-03-17 ENCOUNTER — Other Ambulatory Visit: Payer: Self-pay

## 2021-03-17 DIAGNOSIS — K219 Gastro-esophageal reflux disease without esophagitis: Secondary | ICD-10-CM

## 2021-03-17 DIAGNOSIS — H9113 Presbycusis, bilateral: Secondary | ICD-10-CM

## 2021-03-17 DIAGNOSIS — H903 Sensorineural hearing loss, bilateral: Secondary | ICD-10-CM

## 2021-03-17 MED ORDER — GADOBENATE DIMEGLUMINE 529 MG/ML IV SOLN
20.0000 mL | Freq: Once | INTRAVENOUS | Status: AC | PRN
  Administered 2021-03-17: 20 mL via INTRAVENOUS
